# Patient Record
Sex: Male | Born: 1975 | Race: White | Hispanic: No | Marital: Single | Smoking: Never smoker
Health system: Southern US, Community
[De-identification: ages and names within clinical notes are randomized; demographics above are authoritative.]

## PROBLEM LIST (undated history)

## (undated) DIAGNOSIS — N289 Disorder of kidney and ureter, unspecified: Secondary | ICD-10-CM

## (undated) HISTORY — PX: CHOLECYSTECTOMY: SHX55

---

## 2011-11-20 ENCOUNTER — Encounter (HOSPITAL_COMMUNITY): Payer: Self-pay | Admitting: *Deleted

## 2011-11-20 ENCOUNTER — Emergency Department (HOSPITAL_COMMUNITY): Payer: Worker's Compensation

## 2011-11-20 ENCOUNTER — Emergency Department (HOSPITAL_COMMUNITY)
Admission: EM | Admit: 2011-11-20 | Discharge: 2011-11-20 | Disposition: A | Discharge status: Home / Self Care | Payer: Worker's Compensation | Attending: Emergency Medicine | Admitting: Emergency Medicine

## 2011-11-20 DIAGNOSIS — S5000XA Contusion of unspecified elbow, initial encounter: Secondary | ICD-10-CM | POA: Insufficient documentation

## 2011-11-20 DIAGNOSIS — Y9269 Other specified industrial and construction area as the place of occurrence of the external cause: Secondary | ICD-10-CM | POA: Insufficient documentation

## 2011-11-20 DIAGNOSIS — Z79899 Other long term (current) drug therapy: Secondary | ICD-10-CM | POA: Insufficient documentation

## 2011-11-20 DIAGNOSIS — Y990 Civilian activity done for income or pay: Secondary | ICD-10-CM | POA: Insufficient documentation

## 2011-11-20 DIAGNOSIS — W1789XA Other fall from one level to another, initial encounter: Secondary | ICD-10-CM | POA: Insufficient documentation

## 2011-11-20 DIAGNOSIS — S20219A Contusion of unspecified front wall of thorax, initial encounter: Secondary | ICD-10-CM | POA: Insufficient documentation

## 2011-11-20 MED ORDER — METHOCARBAMOL 500 MG PO TABS
1000.0000 mg | ORAL_TABLET | Freq: Once | ORAL | Status: AC
  Administered 2011-11-20: 1000 mg via ORAL
  Filled 2011-11-20: qty 2

## 2011-11-20 MED ORDER — HYDROCODONE-ACETAMINOPHEN 5-325 MG PO TABS
2.0000 | ORAL_TABLET | Freq: Once | ORAL | Status: AC
  Administered 2011-11-20: 2 via ORAL
  Filled 2011-11-20: qty 2

## 2011-11-20 MED ORDER — IBUPROFEN 800 MG PO TABS
800.0000 mg | ORAL_TABLET | Freq: Once | ORAL | Status: AC
  Administered 2011-11-20: 800 mg via ORAL
  Filled 2011-11-20: qty 1

## 2011-11-20 MED ORDER — ONDANSETRON HCL 4 MG PO TABS
4.0000 mg | ORAL_TABLET | Freq: Once | ORAL | Status: AC
  Administered 2011-11-20: 4 mg via ORAL
  Filled 2011-11-20: qty 1

## 2011-11-20 MED ORDER — IBUPROFEN 800 MG PO TABS: 800.0000 mg | ORAL_TABLET | Freq: Three times a day (TID) | ORAL | Status: AC

## 2011-11-20 MED ORDER — METHOCARBAMOL 500 MG PO TABS: ORAL_TABLET | ORAL | Status: DC

## 2011-11-20 MED ORDER — HYDROCODONE-ACETAMINOPHEN 5-500 MG PO TABS: 1.0000 | ORAL_TABLET | ORAL | Status: AC | PRN

## 2011-11-20 NOTE — ED Provider Notes (Signed)
History     CSN: [id number]  Arrival date & time 11/20/11  1324   First MD Initiated Contact with Patient 11/20/11 1422      Chief Complaint  Patient presents with  . Fall    (Consider location/radiation/quality/duration/timing/severity/associated sxs/prior treatment) HPI Comments: Patient states that on January 23 while at work, he was standing/walking on metal racks when he fell approximately 5-6 feet injuring his left elbow and left ribs. The patient had some soreness immediately after the fall but did not feel that he was incapacitated. Today he has pain with attempted range of motion of the elbow on the left, he has pain with taking deep breaths, and pain with palpation over the rib area. He presents at this time for additional evaluation of this problem.  Patient is a 36 y.o. male presenting with fall. The history is provided by the patient.  Fall Pertinent negatives include no abdominal pain and no hematuria.    History reviewed. No pertinent past medical history.  History reviewed. No pertinent past surgical history.  History reviewed. No pertinent family history.  History  Substance Use Topics  . Smoking status: Never Smoker   . Smokeless tobacco: Not on file  . Alcohol Use: Yes      Review of Systems  Constitutional: Negative for activity change.       All ROS Neg except as noted in HPI  HENT: Negative for nosebleeds and neck pain.   Eyes: Negative for photophobia and discharge.  Respiratory: Negative for cough, shortness of breath and wheezing.   Cardiovascular: Negative for chest pain and palpitations.  Gastrointestinal: Negative for abdominal pain and blood in stool.  Genitourinary: Negative for dysuria, frequency and hematuria.  Musculoskeletal: Negative for back pain and arthralgias.  Skin: Negative.   Neurological: Negative for dizziness, seizures and speech difficulty.  Psychiatric/Behavioral: Negative for hallucinations and confusion.    Allergies   Review of patient's allergies indicates no known allergies.  Home Medications   Current Outpatient Rx  Name Route Sig Dispense Refill  . AMPHETAMINE-DEXTROAMPHETAMINE 20 MG PO TABS Oral Take 20 mg by mouth 2 (two) times daily.    Marland Kitchen DIAZEPAM 10 MG PO TABS Oral Take 10 mg by mouth 3 (three) times daily as needed. Anger    . HYDROCODONE-ACETAMINOPHEN 10-500 MG PO TABS Oral Take 1 tablet by mouth 4 (four) times daily as needed. Pain    . NAPROXEN SODIUM 220 MG PO TABS Oral Take 220 mg by mouth 2 (two) times daily as needed. Headache    . HYDROCODONE-ACETAMINOPHEN 5-500 MG PO TABS Oral Take 1-2 tablets by mouth every 4 (four) hours as needed for pain. 20 tablet 0  . IBUPROFEN 800 MG PO TABS Oral Take 1 tablet (800 mg total) by mouth 3 (three) times daily. 21 tablet 0  . METHOCARBAMOL 500 MG PO TABS  2 tabs po for rib and back spasm 30 tablet 0    BP 140/79  Pulse 108  Temp(Src) 98 F (36.7 C) (Oral)  Resp 20  Ht 5\' 4"  (1.626 m)  Wt 152 lb (68.947 kg)  BMI 26.09 kg/m2  SpO2 98%  Physical Exam  Nursing note and vitals reviewed. Constitutional: He is oriented to person, place, and time. He appears well-developed and well-nourished.  Non-toxic appearance.  HENT:  Head: Normocephalic.  Right Ear: Tympanic membrane and external ear normal.  Left Ear: Tympanic membrane and external ear normal.  Eyes: EOM and lids are normal. Pupils are equal, round, and reactive  to light.  Neck: Normal range of motion. Neck supple. Carotid bruit is not present.  Cardiovascular: Normal rate, regular rhythm, normal heart sounds, intact distal pulses and normal pulses.   Pulmonary/Chest: Breath sounds normal. No respiratory distress.       Pain to palpation of the left mid to lower rib area, there is a bruise to the lateral left rib area. There is no crepitus and no palpable deformity.  Abdominal: Soft. Bowel sounds are normal. There is no tenderness. There is no guarding.  Musculoskeletal: Normal range of  motion.       There is full range of motion of the left shoulder, with some discomfort. There is pain with range of motion of the left elbow. There is good range of motion of the left wrist and fingers.  Lymphadenopathy:       Head (right side): No submandibular adenopathy present.       Head (left side): No submandibular adenopathy present.    He has no cervical adenopathy.  Neurological: He is alert and oriented to person, place, and time. He has normal strength. No cranial nerve deficit or sensory deficit.  Skin: Skin is warm and dry.  Psychiatric: He has a normal mood and affect. His speech is normal.    ED Course  Procedures (including critical care time)  Labs Reviewed - No data to display Dg Ribs Unilateral W/chest Left  11/20/2011  *RADIOLOGY REPORT*  Clinical Data: Fall  LEFT RIBS AND CHEST - 3+ VIEW  Comparison: None  Findings: The heart size and mediastinal contours are within normal limits.  There is a small hiatal hernia.  Both lungs are clear. The visualized skeletal structures are unremarkable.  IMPRESSION: No acute cardiopulmonary abnormalities.  No displaced rib fractures noted.  Hiatal hernia.  Original Report Authenticated By: Rosealee Albee, M.D.   Dg Elbow Complete Left  11/20/2011  *RADIOLOGY REPORT*  Clinical Data: Fall.  Pain  LEFT ELBOW - COMPLETE 3+ VIEW  Comparison: None.  Findings: There is no evidence of fracture or dislocation.  There is no evidence of arthropathy or other focal bone abnormality. Soft tissues are unremarkable.  IMPRESSION: Negative exam.  Original Report Authenticated By: Rosealee Albee, M.D.     1. Contusion, elbow   2. Contusion of ribs       MDM  I have reviewed nursing notes, vital signs, and all appropriate lab and imaging results for this patient.  X-rays are negative. Sling applied to the left elbow. Prescription for Vicodin 5 mg ibuprofen 800 mg, and Robaxin 500 mg given to the patient.      Kathie Dike, [place] 11/20/11  [phone]

## 2011-11-20 NOTE — ED Notes (Signed)
Fell at work, standing on metal rack ,fell ? Feet.  Pt unsure.  Pain lt elbow  And lt ribs.  No LOC

## 2011-11-23 NOTE — ED Provider Notes (Signed)
Medical screening examination/treatment/procedure(s) were performed by non-physician practitioner and as supervising physician I was immediately available for consultation/collaboration.   Shelda Jakes, MD 11/23/11 2023

## 2012-10-12 ENCOUNTER — Encounter (HOSPITAL_COMMUNITY): Payer: Self-pay

## 2012-10-12 ENCOUNTER — Emergency Department (HOSPITAL_COMMUNITY)
Admission: EM | Admit: 2012-10-12 | Discharge: 2012-10-12 | Disposition: A | Discharge status: Home / Self Care | Payer: Self-pay | Attending: Emergency Medicine | Admitting: Emergency Medicine

## 2012-10-12 ENCOUNTER — Emergency Department (HOSPITAL_COMMUNITY): Payer: Self-pay

## 2012-10-12 DIAGNOSIS — N23 Unspecified renal colic: Secondary | ICD-10-CM | POA: Insufficient documentation

## 2012-10-12 DIAGNOSIS — N390 Urinary tract infection, site not specified: Secondary | ICD-10-CM | POA: Insufficient documentation

## 2012-10-12 DIAGNOSIS — R319 Hematuria, unspecified: Secondary | ICD-10-CM | POA: Insufficient documentation

## 2012-10-12 DIAGNOSIS — R300 Dysuria: Secondary | ICD-10-CM | POA: Insufficient documentation

## 2012-10-12 DIAGNOSIS — R112 Nausea with vomiting, unspecified: Secondary | ICD-10-CM | POA: Insufficient documentation

## 2012-10-12 DIAGNOSIS — Z87442 Personal history of urinary calculi: Secondary | ICD-10-CM | POA: Insufficient documentation

## 2012-10-12 HISTORY — DX: Disorder of kidney and ureter, unspecified: N28.9

## 2012-10-12 LAB — URINALYSIS, ROUTINE W REFLEX MICROSCOPIC
Bilirubin Urine: NEGATIVE
Glucose, UA: NEGATIVE mg/dL
Nitrite: NEGATIVE
Specific Gravity, Urine: >1.03 — ABNORMAL HIGH (ref 1.005–1.030)
pH: 5.5 (ref 5.0–8.0)

## 2012-10-12 LAB — URINE MICROSCOPIC-ADD ON

## 2012-10-12 MED ORDER — CIPROFLOXACIN HCL 500 MG PO TABS: 500.0000 mg | ORAL_TABLET | Freq: Two times a day (BID) | ORAL | Status: DC

## 2012-10-12 MED ORDER — OXYCODONE-ACETAMINOPHEN 5-325 MG PO TABS: 1.0000 | ORAL_TABLET | ORAL | Status: AC | PRN

## 2012-10-12 MED ORDER — ONDANSETRON HCL 4 MG/2ML IJ SOLN
4.0000 mg | Freq: Once | INTRAMUSCULAR | Status: AC
  Administered 2012-10-12: 4 mg via INTRAVENOUS
  Filled 2012-10-12: qty 2

## 2012-10-12 MED ORDER — TAMSULOSIN HCL 0.4 MG PO CAPS: 0.4000 mg | ORAL_CAPSULE | Freq: Every day | ORAL | Status: DC

## 2012-10-12 MED ORDER — HYDROMORPHONE HCL PF 2 MG/ML IJ SOLN
2.0000 mg | Freq: Once | INTRAMUSCULAR | Status: AC
  Administered 2012-10-12: 2 mg via INTRAVENOUS
  Filled 2012-10-12: qty 1

## 2012-10-12 MED ORDER — KETOROLAC TROMETHAMINE 30 MG/ML IJ SOLN
30.0000 mg | Freq: Once | INTRAMUSCULAR | Status: AC
  Administered 2012-10-12: 30 mg via INTRAVENOUS
  Filled 2012-10-12: qty 1

## 2012-10-12 NOTE — ED Notes (Signed)
Complain of left flank pain since Saturday. Hx of stones

## 2012-10-12 NOTE — ED Provider Notes (Signed)
History     CSN: [id number]  Arrival date & time 10/12/12  1549   First MD Initiated Contact with Patient 10/12/12 1652      Chief Complaint  Patient presents with  . Flank Pain    (Consider location/radiation/quality/duration/timing/severity/associated sxs/prior treatment) HPI Comments: Is a 36 year old man who presents with left lower quadrant abdominal pain. This started about 3 days ago, and has gotten worse yesterday evening and today. He had vomiting yesterday. He is seeing blood in his urine. He has a prior history of kidney stones. He has been able to pass all of the stones in the past.  Patient is a 36 y.o. male presenting with flank pain. The history is provided by the patient. No language interpreter was used.  Flank Pain This is a new problem. The current episode started more than 2 days ago. Episode frequency: He has pain in the left flank and left lower quadrant intermittently, worse today. The problem has been gradually worsening. Associated symptoms comments: None.. Nothing aggravates the symptoms. Nothing relieves the symptoms. He has tried nothing for the symptoms.    Past Medical History  Diagnosis Date  . Renal disorder     History reviewed. No pertinent past surgical history.  No family history on file.  History  Substance Use Topics  . Smoking status: Never Smoker   . Smokeless tobacco: Not on file  . Alcohol Use: Yes      Review of Systems  Constitutional: Negative.   HENT: Negative.   Eyes: Negative.   Respiratory: Negative.   Cardiovascular: Negative.   Gastrointestinal: Positive for nausea and vomiting.       He had some vomiting yesterday.  Genitourinary: Positive for dysuria, hematuria and flank pain.  Musculoskeletal: Negative.   Skin: Negative.   Neurological: Negative.   Psychiatric/Behavioral: Negative.     Allergies  Review of patient's allergies indicates no known allergies.  Home Medications   Current Outpatient Rx  Name   Route  Sig  Dispense  Refill  . DIAZEPAM 10 MG PO TABS   Oral   Take 10 mg by mouth 3 (three) times daily as needed. Anger           BP 146/128  Pulse 106  Temp 97.9 F (36.6 C) (Oral)  Resp 22  Ht 5\' 4"  (1.626 m)  Wt 167 lb (75.751 kg)  BMI 28.67 kg/m2  SpO2 100%  Physical Exam  Nursing note and vitals reviewed. Constitutional: He is oriented to person, place, and time. He appears well-developed and well-nourished.       Patient and acute distress with left flank and left lower quadrant pain.  HENT:  Head: Normocephalic and atraumatic.  Right Ear: External ear normal.  Left Ear: External ear normal.  Mouth/Throat: Oropharynx is clear and moist.  Eyes: Conjunctivae normal and EOM are normal. Pupils are equal, round, and reactive to light.  Neck: Normal range of motion. Neck supple.  Cardiovascular: Normal rate and regular rhythm.   Pulmonary/Chest: Effort normal and breath sounds normal.  Abdominal: Soft. Bowel sounds are normal.       He localizes pain to the left flank and left lower quadrant. There is no mass or point tenderness.  Musculoskeletal: Normal range of motion.  Neurological: He is alert and oriented to person, place, and time.       No sensory or motor deficit.  Skin: Skin is warm and dry.  Psychiatric: He has a normal mood and affect. His behavior is  normal.    ED Course  Procedures (including critical care time)  Labs Reviewed  URINALYSIS, ROUTINE W REFLEX MICROSCOPIC - Abnormal; Notable for the following:    Specific Gravity, Urine >1.030 (*)     Hgb urine dipstick LARGE (*)     All other components within normal limits  URINE MICROSCOPIC-ADD ON - Abnormal; Notable for the following:    Bacteria, UA MANY (*)     All other components within normal limits  URINALYSIS, ROUTINE W REFLEX MICROSCOPIC   5:20 PM Patient was seen and had physical examination. Urinalysis showed hematuria. CT of the abdomen and pelvis without contrast was ordered.  Intravenous pain and nausea medicines were ordered.  6:47 PM His pain had eased, but now has come back full force.  Repeated IV Dilaudid, added IV Toradol. Waiting for reading on his CT of the abdomen/pelvis.  7:19 PM Results for orders placed during the hospital encounter of 10/12/12  URINALYSIS, ROUTINE W REFLEX MICROSCOPIC      Component Value Range   Color, Urine YELLOW  YELLOW   APPearance CLEAR  CLEAR   Specific Gravity, Urine >1.030 (*) 1.005 - 1.030   pH 5.5  5.0 - 8.0   Glucose, UA NEGATIVE  NEGATIVE mg/dL   Hgb urine dipstick LARGE (*) NEGATIVE   Bilirubin Urine NEGATIVE  NEGATIVE   Ketones, ur NEGATIVE  NEGATIVE mg/dL   Protein, ur NEGATIVE  NEGATIVE mg/dL   Urobilinogen, UA 0.2  0.0 - 1.0 mg/dL   Nitrite NEGATIVE  NEGATIVE   Leukocytes, UA NEGATIVE  NEGATIVE  URINE MICROSCOPIC-ADD ON      Component Value Range   RBC / HPF TOO NUMEROUS TO COUNT  <3 RBC/hpf   Bacteria, UA MANY (*) RARE   Ct Abdomen Pelvis Wo Contrast  10/12/2012  *RADIOLOGY REPORT*  Clinical Data: Left flank and left lower quadrant pain.  History of kidney stones.  CT ABDOMEN AND PELVIS WITHOUT CONTRAST  Technique:  Multidetector CT imaging of the abdomen and pelvis was performed following the standard protocol without intravenous contrast.  Comparison: None.  Findings: Large hiatal hernia.  Heart is normal size.  Lung bases are clear.  No effusions.  Gallbladder is contracted.  Liver, spleen, pancreas, adrenals and right kidney have an unremarkable unenhanced appearance.  4 mm stone in the mid pole of the left kidney.  No hydronephrosis.  No ureteral stones.  Calcifications in the anatomic pelvis bilaterally are outside of the distal ureters compatible with phleboliths. Urinary bladder is decompressed.  Appendix is visualized and is normal.  Scattered descending colonic diverticula without active diverticulitis.  Stomach is grossly unremarkable.  Small bowel is decompressed.  No free fluid, free air or  adenopathy.  No acute bony abnormality.  Aorta and iliac vessels are normal caliber.  IMPRESSION: Left nephrolithiasis.  No ureteral stones or hydronephrosis.  Large hiatal hernia.   Original Report Authenticated By: Charlett Nose, M.D.     UA shows many bacteria.  Will Rx for UTI, pain.  CT of abdomen/pelvis shows a left renal stone but no ureteral stone.  Referral to Dr. Jerre Simon, urologist on call, for followup.  1. Renal colic on left side   2. UTI (lower urinary tract infection)        Carleene Cooper III, MD 10/12/12 1924

## 2014-08-15 ENCOUNTER — Emergency Department (HOSPITAL_COMMUNITY)
Admission: EM | Admit: 2014-08-15 | Discharge: 2014-08-15 | Disposition: A | Discharge status: Home / Self Care | Payer: Self-pay | Attending: Emergency Medicine | Admitting: Emergency Medicine

## 2014-08-15 ENCOUNTER — Encounter (HOSPITAL_COMMUNITY): Payer: Self-pay | Admitting: Emergency Medicine

## 2014-08-15 ENCOUNTER — Emergency Department (HOSPITAL_COMMUNITY): Payer: Self-pay

## 2014-08-15 DIAGNOSIS — N200 Calculus of kidney: Secondary | ICD-10-CM | POA: Insufficient documentation

## 2014-08-15 DIAGNOSIS — R109 Unspecified abdominal pain: Secondary | ICD-10-CM

## 2014-08-15 DIAGNOSIS — R319 Hematuria, unspecified: Secondary | ICD-10-CM

## 2014-08-15 LAB — URINE MICROSCOPIC-ADD ON

## 2014-08-15 LAB — CBC WITH DIFFERENTIAL/PLATELET
Basophils Absolute: 0 10*3/uL (ref 0.0–0.1)
Basophils Relative: 0 % (ref 0–1)
EOS ABS: 0.4 10*3/uL (ref 0.0–0.7)
EOS PCT: 5 % (ref 0–5)
HEMATOCRIT: 45.7 % (ref 39.0–52.0)
Hemoglobin: 16.2 g/dL (ref 13.0–17.0)
LYMPHS ABS: 2.8 10*3/uL (ref 0.7–4.0)
LYMPHS PCT: 30 % (ref 12–46)
MCH: 32.8 pg (ref 26.0–34.0)
MCHC: 35.4 g/dL (ref 30.0–36.0)
MCV: 92.5 fL (ref 78.0–100.0)
MONO ABS: 0.7 10*3/uL (ref 0.1–1.0)
Monocytes Relative: 7 % (ref 3–12)
Neutro Abs: 5.3 10*3/uL (ref 1.7–7.7)
Neutrophils Relative %: 58 % (ref 43–77)
PLATELETS: 339 10*3/uL (ref 150–400)
RBC: 4.94 MIL/uL (ref 4.22–5.81)
RDW: 12.3 % (ref 11.5–15.5)
WBC: 9.2 10*3/uL (ref 4.0–10.5)

## 2014-08-15 LAB — URINALYSIS, ROUTINE W REFLEX MICROSCOPIC
BILIRUBIN URINE: NEGATIVE
GLUCOSE, UA: NEGATIVE mg/dL
Ketones, ur: NEGATIVE mg/dL
Leukocytes, UA: NEGATIVE
Nitrite: NEGATIVE
PROTEIN: NEGATIVE mg/dL
Specific Gravity, Urine: >1.03 — ABNORMAL HIGH (ref 1.005–1.030)
UROBILINOGEN UA: 0.2 mg/dL (ref 0.0–1.0)
pH: 5.5 (ref 5.0–8.0)

## 2014-08-15 LAB — BASIC METABOLIC PANEL
Anion gap: 11 (ref 5–15)
BUN: 18 mg/dL (ref 6–23)
CALCIUM: 9.8 mg/dL (ref 8.4–10.5)
CO2: 29 meq/L (ref 19–32)
CREATININE: 0.9 mg/dL (ref 0.50–1.35)
Chloride: 100 mEq/L (ref 96–112)
GFR calc Af Amer: >90 mL/min (ref >90)
GLUCOSE: 92 mg/dL (ref 70–99)
Potassium: 4.5 mEq/L (ref 3.7–5.3)
SODIUM: 140 meq/L (ref 137–147)

## 2014-08-15 MED ORDER — HYDROMORPHONE HCL 1 MG/ML IJ SOLN
1.0000 mg | INTRAMUSCULAR | Status: DC | PRN
  Administered 2014-08-15: 1 mg via INTRAVENOUS
  Filled 2014-08-15: qty 1

## 2014-08-15 MED ORDER — SODIUM CHLORIDE 0.9 % IV SOLN: Freq: Once | INTRAVENOUS | Status: DC

## 2014-08-15 MED ORDER — ONDANSETRON HCL 4 MG/2ML IJ SOLN
4.0000 mg | Freq: Once | INTRAMUSCULAR | Status: AC
  Administered 2014-08-15: 4 mg via INTRAVENOUS
  Filled 2014-08-15: qty 2

## 2014-08-15 MED ORDER — OXYCODONE-ACETAMINOPHEN 5-325 MG PO TABS
2.0000 | ORAL_TABLET | Freq: Once | ORAL | Status: AC
  Administered 2014-08-15: 2 via ORAL
  Filled 2014-08-15: qty 2

## 2014-08-15 MED ORDER — OXYCODONE-ACETAMINOPHEN 5-325 MG PO TABS: 2.0000 | ORAL_TABLET | ORAL | Status: DC | PRN

## 2014-08-15 MED ORDER — IBUPROFEN 800 MG PO TABS: 800.0000 mg | ORAL_TABLET | Freq: Three times a day (TID) | ORAL | Status: DC

## 2014-08-15 MED ORDER — TAMSULOSIN HCL 0.4 MG PO CAPS: 0.4000 mg | ORAL_CAPSULE | Freq: Every day | ORAL | Status: DC

## 2014-08-15 MED ORDER — TAMSULOSIN HCL 0.4 MG PO CAPS
0.4000 mg | ORAL_CAPSULE | Freq: Once | ORAL | Status: AC
  Administered 2014-08-15: 0.4 mg via ORAL
  Filled 2014-08-15: qty 1

## 2014-08-15 MED ORDER — KETOROLAC TROMETHAMINE 30 MG/ML IJ SOLN
30.0000 mg | Freq: Once | INTRAMUSCULAR | Status: AC
  Administered 2014-08-15: 30 mg via INTRAVENOUS
  Filled 2014-08-15: qty 1

## 2014-08-15 MED ORDER — SODIUM CHLORIDE 0.9 % IV BOLUS (SEPSIS)
1000.0000 mL | Freq: Once | INTRAVENOUS | Status: AC
  Administered 2014-08-15: 1000 mL via INTRAVENOUS

## 2014-08-15 NOTE — ED Notes (Signed)
Pain lt flank for 1 week, nausea.  No vomiting, Hx of kidney stones

## 2014-08-15 NOTE — ED Provider Notes (Signed)
CSN: [id number]     Arrival date & time 08/15/14  1257 History  This chart was scribed for Mark PorterMark Martia Dalby, MD by Mark Andrade, ED Scribe. This patient was seen in room [facility] and the patient's care was started 2:02 PM.    Chief Complaint  Patient presents with  . Flank Pain   The history is provided by the patient. No language interpreter was used.    HPI Comments: Mark SingerGregory Andrade is a 38 y.o. male with past medical history of kidney stones who presents to the Emergency Department complaining of 5-7 days of gradual onset, constant left flank pain that worsened with radiation to the left groin and testicle last night. He rates his pain as 10/10. He has associated nausea. He reports the last episode for which he was seen in the ED was 2 years ago. He denies vomiting.   Past Medical History  Diagnosis Date  . Renal disorder    History reviewed. No pertinent past surgical history. History reviewed. No pertinent family history. History  Substance Use Topics  . Smoking status: Never Smoker   . Smokeless tobacco: Not on file  . Alcohol Use: Yes    Review of Systems  Constitutional: Negative.  Negative for fever.  HENT: Negative.   Eyes: Negative.   Respiratory: Negative.   Cardiovascular: Negative.   Gastrointestinal: Positive for nausea. Negative for vomiting.  Genitourinary: Positive for flank pain and testicular pain.  Neurological: Negative.   Psychiatric/Behavioral: Negative.   All other systems reviewed and are negative.     Allergies  Review of patient's allergies indicates no known allergies.  Home Medications   Prior to Admission medications   Medication Sig Start Date End Date Taking? Authorizing Provider  ibuprofen (ADVIL,MOTRIN) 800 MG tablet Take 1 tablet (800 mg total) by mouth 3 (three) times daily. 08/15/14   Mark PorterMark Zamariah Seaborn, MD  oxyCODONE-acetaminophen (PERCOCET/ROXICET) 5-325 MG per tablet Take 2 tablets by mouth every 4 (four) hours as needed. 08/15/14   Mark PorterMark  Tesa Meadors, MD  tamsulosin (FLOMAX) 0.4 MG CAPS capsule Take 1 capsule (0.4 mg total) by mouth daily. 08/15/14   Mark PorterMark Alvy Alsop, MD   BP 131/96  Pulse 85  Temp(Src) 98.6 F (37 C) (Oral)  Resp 20  Ht 5\' 5"  (1.651 m)  Wt 167 lb (75.751 kg)  BMI 27.79 kg/m2  SpO2 100% Physical Exam  Nursing note and vitals reviewed. Constitutional: He is oriented to person, place, and time. He appears well-developed and well-nourished. No distress.  Leaning forward, appears apprehensive   HENT:  Head: Normocephalic.  Eyes: Conjunctivae are normal. Pupils are equal, round, and reactive to light. No scleral icterus.  Neck: Normal range of motion. Neck supple. No thyromegaly present.  Cardiovascular: Normal rate and regular rhythm.  Exam reveals no gallop and no friction rub.   No murmur heard. Pulmonary/Chest: Effort normal and breath sounds normal. No respiratory distress. He has no wheezes. He has no rales.  Abdominal: Soft. Bowel sounds are normal. He exhibits no distension. There is no tenderness. There is no rebound.  Genitourinary:  Left CVA tenderness   Musculoskeletal: Normal range of motion.  Neurological: He is alert and oriented to person, place, and time.  Skin: Skin is warm and dry. No rash noted.  Psychiatric: He has a normal mood and affect. His behavior is normal.    ED Course  Procedures   DIAGNOSTIC STUDIES: Oxygen Saturation is 100% on RA, normal by my interpretation.    COORDINATION OF CARE: 2:03 PM  Discussed treatment plan with pt at bedside and pt agreed to plan.   Labs Review Labs Reviewed  URINALYSIS, ROUTINE W REFLEX MICROSCOPIC - Abnormal; Notable for the following:    Specific Gravity, Urine >1.030 (*)    Hgb urine dipstick LARGE (*)    All other components within normal limits  CBC WITH DIFFERENTIAL  BASIC METABOLIC PANEL  URINE MICROSCOPIC-ADD ON    Imaging Review Ct Renal Stone Study  08/15/2014   CLINICAL DATA:  Left flank pain for 1 week.  Nausea and  hematuria.  EXAM: CT ABDOMEN AND PELVIS WITHOUT CONTRAST  TECHNIQUE: Multidetector CT imaging of the abdomen and pelvis was performed following the standard protocol without IV contrast.  COMPARISON:  CT abdomen and pelvis 10/12/2012.  FINDINGS: The lung bases are clear. No pleural or pericardial effusion. Large hiatal hernia is unchanged.  A 0.6 cm nonobstructing stone is seen in the midpole of the left kidney. There is no hydronephrosis on the right or left and no other urinary tract stones are identified.  Multiple gallstones are seen but there is no CT evidence of cholecystitis. The liver, adrenal glands, pancreas and biliary tree appear normal. The stomach, small and large bowel and appendix appear normal. No lytic or sclerotic bony lesion is identified.  IMPRESSION: No acute abnormality.  No change in a 0.6 cm nonobstructing left renal stone.  Multiple gallstones without CT evidence of cholecystitis.  Large hiatal hernia.   Electronically Signed   By: Drusilla Kannerhomas  Dalessio M.D.   On: 08/15/2014 14:44     EKG Interpretation None      MDM   Final diagnoses:  Flank pain  Kidney stone    Pt with significant relief after IV meds.  CT shows left renal stone. No dilatation of the collecting system currently. Possibilities passed a small stone. However the symptoms may be completely unrelated to his solitary 6 mm stone. Nonetheless he is symptom-free. Given urology for followup.   Mark PorterMark Trevante Tennell, MD 08/15/14 [phone]

## 2014-08-15 NOTE — Discharge Instructions (Signed)
Flank Pain °Flank pain refers to pain that is located on the side of the body between the upper abdomen and the back. The pain may occur over a short period of time (acute) or may be long-term or reoccurring (chronic). It may be mild or severe. Flank pain can be caused by many things. °CAUSES  °Some of the more common causes of flank pain include: °· Muscle strains.   °· Muscle spasms.   °· A disease of your spine (vertebral disk disease).   °· A lung infection (pneumonia).   °· Fluid around your lungs (pulmonary edema).   °· A kidney infection.   °· Kidney stones.   °· A very painful skin rash caused by the chickenpox virus (shingles).   °· Gallbladder disease.   °HOME CARE INSTRUCTIONS  °Home care will depend on the cause of your pain. In general, °· Rest as directed by your caregiver. °· Drink enough fluids to keep your urine clear or pale yellow. °· Only take over-the-counter or prescription medicines as directed by your caregiver. Some medicines may help relieve the pain. °· Tell your caregiver about any changes in your pain. °· Follow up with your caregiver as directed. °SEEK IMMEDIATE MEDICAL CARE IF:  °· Your pain is not controlled with medicine.   °· You have new or worsening symptoms. °· Your pain increases.   °· You have abdominal pain.   °· You have shortness of breath.   °· You have persistent nausea or vomiting.   °· You have swelling in your abdomen.   °· You feel faint or pass out.   °· You have blood in your urine. °· You have a fever or persistent symptoms for more than 2-3 days. °· You have a fever and your symptoms suddenly get worse. °MAKE SURE YOU:  °· Understand these instructions. °· Will watch your condition. °· Will get help right away if you are not doing well or get worse. °Document Released: 12/04/2005 Document Revised: 07/07/2012 Document Reviewed: 05/27/2012 °ExitCare® Patient Information ©2015 [employer]. This information is not intended to replace advice given to you by your  health care provider. Make sure you discuss any questions you have with your health care provider. ° °Kidney Stones °Kidney stones (urolithiasis) are deposits that form inside your kidneys. The intense pain is caused by the stone moving through the urinary tract. When the stone moves, the ureter goes into spasm around the stone. The stone is usually passed in the urine.  °CAUSES  °· A disorder that makes certain neck glands produce too much parathyroid hormone (primary hyperparathyroidism). °· A buildup of uric acid crystals, similar to gout in your joints. °· Narrowing (stricture) of the ureter. °· A kidney obstruction present at birth (congenital obstruction). °· Previous surgery on the kidney or ureters. °· Numerous kidney infections. °SYMPTOMS  °· Feeling sick to your stomach (nauseous). °· Throwing up (vomiting). °· Blood in the urine (hematuria). °· Pain that usually spreads (radiates) to the groin. °· Frequency or urgency of urination. °DIAGNOSIS  °· Taking a history and physical exam. °· Blood or urine tests. °· CT scan. °· Occasionally, an examination of the inside of the urinary bladder (cystoscopy) is performed. °TREATMENT  °· Observation. °· Increasing your fluid intake. °· Extracorporeal shock wave lithotripsy--This is a noninvasive procedure that uses shock waves to break up kidney stones. °· Surgery may be needed if you have severe pain or persistent obstruction. There are various surgical procedures. Most of the procedures are performed with the use of small instruments. Only small incisions are needed to accommodate these instruments,   so recovery time is minimized. °The size, location, and chemical composition are all important variables that will determine the proper choice of action for you. Talk to your health care provider to better understand your situation so that you will minimize the risk of injury to yourself and your kidney.  °HOME CARE INSTRUCTIONS  °· Drink enough water and fluids to  keep your urine clear or pale yellow. This will help you to pass the stone or stone fragments. °· Strain all urine through the provided strainer. Keep all particulate matter and stones for your health care provider to see. The stone causing the pain may be as small as a grain of salt. It is very important to use the strainer each and every time you pass your urine. The collection of your stone will allow your health care provider to analyze it and verify that a stone has actually passed. The stone analysis will often identify what you can do to reduce the incidence of recurrences. °· Only take over-the-counter or prescription medicines for pain, discomfort, or fever as directed by your health care provider. °· Make a follow-up appointment with your health care provider as directed. °· Get follow-up X-rays if required. The absence of pain does not always mean that the stone has passed. It may have only stopped moving. If the urine remains completely obstructed, it can cause loss of kidney function or even complete destruction of the kidney. It is your responsibility to make sure X-rays and follow-ups are completed. Ultrasounds of the kidney can show blockages and the status of the kidney. Ultrasounds are not associated with any radiation and can be performed easily in a matter of minutes. °SEEK MEDICAL CARE IF: °· You experience pain that is progressive and unresponsive to any pain medicine you have been prescribed. °SEEK IMMEDIATE MEDICAL CARE IF:  °· Pain cannot be controlled with the prescribed medicine. °· You have a fever or shaking chills. °· The severity or intensity of pain increases over 18 hours and is not relieved by pain medicine. °· You develop a new onset of abdominal pain. °· You feel faint or pass out. °· You are unable to urinate. °MAKE SURE YOU:  °· Understand these instructions. °· Will watch your condition. °· Will get help right away if you are not doing well or get worse. °Document Released:  10/13/2005 Document Revised: 06/15/2013 Document Reviewed: 03/16/2013 °ExitCare® Patient Information ©2015 [employer]. This information is not intended to replace advice given to you by your health care provider. Make sure you discuss any questions you have with your health care provider. ° °

## 2015-09-29 ENCOUNTER — Emergency Department (HOSPITAL_COMMUNITY)
Admission: EM | Admit: 2015-09-29 | Discharge: 2015-09-29 | Disposition: A | Discharge status: Home / Self Care | Payer: BLUE CROSS/BLUE SHIELD | Attending: Emergency Medicine | Admitting: Emergency Medicine

## 2015-09-29 ENCOUNTER — Encounter (HOSPITAL_COMMUNITY): Payer: Self-pay | Admitting: Emergency Medicine

## 2015-09-29 DIAGNOSIS — N390 Urinary tract infection, site not specified: Secondary | ICD-10-CM

## 2015-09-29 DIAGNOSIS — R300 Dysuria: Secondary | ICD-10-CM | POA: Diagnosis present

## 2015-09-29 DIAGNOSIS — N23 Unspecified renal colic: Secondary | ICD-10-CM | POA: Insufficient documentation

## 2015-09-29 LAB — URINALYSIS, ROUTINE W REFLEX MICROSCOPIC
BILIRUBIN URINE: NEGATIVE
GLUCOSE, UA: NEGATIVE mg/dL
Leukocytes, UA: NEGATIVE
Nitrite: NEGATIVE
PROTEIN: NEGATIVE mg/dL
Specific Gravity, Urine: 1.015 (ref 1.005–1.030)
pH: 8.5 — ABNORMAL HIGH (ref 5.0–8.0)

## 2015-09-29 LAB — BASIC METABOLIC PANEL
Anion gap: 9 (ref 5–15)
BUN: 15 mg/dL (ref 6–20)
CO2: 26 mmol/L (ref 22–32)
CREATININE: 0.84 mg/dL (ref 0.61–1.24)
Calcium: 9.8 mg/dL (ref 8.9–10.3)
Chloride: 105 mmol/L (ref 101–111)
GFR calc Af Amer: >60 mL/min (ref >60)
GFR calc non Af Amer: >60 mL/min (ref >60)
GLUCOSE: 87 mg/dL (ref 65–99)
Potassium: 4 mmol/L (ref 3.5–5.1)
SODIUM: 140 mmol/L (ref 135–145)

## 2015-09-29 LAB — URINE MICROSCOPIC-ADD ON

## 2015-09-29 MED ORDER — CEPHALEXIN 500 MG PO CAPS
500.0000 mg | ORAL_CAPSULE | Freq: Once | ORAL | Status: AC
  Administered 2015-09-29: 500 mg via ORAL
  Filled 2015-09-29: qty 1

## 2015-09-29 MED ORDER — HYDROMORPHONE HCL 1 MG/ML IJ SOLN
1.0000 mg | Freq: Once | INTRAMUSCULAR | Status: AC
  Administered 2015-09-29: 1 mg via INTRAVENOUS
  Filled 2015-09-29: qty 1

## 2015-09-29 MED ORDER — TAMSULOSIN HCL 0.4 MG PO CAPS
0.4000 mg | ORAL_CAPSULE | Freq: Once | ORAL | Status: AC
  Administered 2015-09-29: 0.4 mg via ORAL
  Filled 2015-09-29: qty 1

## 2015-09-29 MED ORDER — CEPHALEXIN 500 MG PO CAPS: 500.0000 mg | ORAL_CAPSULE | Freq: Four times a day (QID) | ORAL | Status: DC

## 2015-09-29 MED ORDER — TAMSULOSIN HCL 0.4 MG PO CAPS: 0.4000 mg | ORAL_CAPSULE | Freq: Every day | ORAL | Status: AC

## 2015-09-29 MED ORDER — OXYCODONE-ACETAMINOPHEN 5-325 MG PO TABS: 1.0000 | ORAL_TABLET | ORAL | Status: DC | PRN

## 2015-09-29 MED ORDER — KETOROLAC TROMETHAMINE 30 MG/ML IJ SOLN
30.0000 mg | Freq: Once | INTRAMUSCULAR | Status: AC
  Administered 2015-09-29: 30 mg via INTRAVENOUS
  Filled 2015-09-29: qty 1

## 2015-09-29 MED ORDER — ONDANSETRON HCL 4 MG/2ML IJ SOLN
4.0000 mg | Freq: Once | INTRAMUSCULAR | Status: AC
  Administered 2015-09-29: 4 mg via INTRAVENOUS
  Filled 2015-09-29: qty 2

## 2015-09-29 MED ORDER — MORPHINE SULFATE (PF) 4 MG/ML IV SOLN
4.0000 mg | Freq: Once | INTRAVENOUS | Status: AC
  Administered 2015-09-29: 4 mg via INTRAVENOUS
  Filled 2015-09-29: qty 1

## 2015-09-29 NOTE — ED Provider Notes (Signed)
CSN: [id number]     Arrival date & time 09/29/15  4098 History   First MD Initiated Contact with Patient 09/29/15 [phone]     Chief Complaint  Patient presents with  . Flank Pain     (Consider location/radiation/quality/duration/timing/severity/associated sxs/prior Treatment) The history is provided by the patient.   Tramon Crescenzo is a 39 y.o. male presenting with sudden onset left flank pain with radiation into his left lower abdomen and groin and consistent with his frequent episodes of kidney stone passage.  He reports passing a stone about 3 weeks ago, prior to that his last episode occurred one year ago.  He denies nausea or emesis, no fevers or chills, he does endorse hematuria, denies urinary frequency but increased pain with urination.  He denies penile discharge, scrotal swelling.  He has had no medications prior to arrival.  He is scheduled to establish care with a urologist in [place], Dr. Odis Luster in about 3 weeks.    Past Medical History  Diagnosis Date  . Renal disorder    History reviewed. No pertinent past surgical history. History reviewed. No pertinent family history. Social History  Substance Use Topics  . Smoking status: Never Smoker   . Smokeless tobacco: None  . Alcohol Use: Yes    Review of Systems  Constitutional: Negative for fever.  HENT: Negative for congestion and sore throat.   Eyes: Negative.   Respiratory: Negative for chest tightness and shortness of breath.   Cardiovascular: Negative for chest pain.  Gastrointestinal: Negative for nausea and abdominal pain.  Genitourinary: Positive for dysuria, hematuria and flank pain. Negative for penile swelling and scrotal swelling.  Musculoskeletal: Negative for joint swelling, arthralgias and neck pain.  Skin: Negative.  Negative for rash and wound.  Neurological: Negative for dizziness, weakness, light-headedness, numbness and headaches.  Psychiatric/Behavioral: Negative.       Allergies  Review of  patient's allergies indicates no known allergies.  Home Medications   Prior to Admission medications   Medication Sig Start Date End Date Taking? Authorizing Provider  cephALEXin (KEFLEX) 500 MG capsule Take 1 capsule (500 mg total) by mouth 4 (four) times daily. 09/29/15   Burgess Amor, PA-C  oxyCODONE-acetaminophen (PERCOCET/ROXICET) 5-325 MG tablet Take 1 tablet by mouth every 4 (four) hours as needed. 09/29/15   Burgess Amor, PA-C  tamsulosin (FLOMAX) 0.4 MG CAPS capsule Take 1 capsule (0.4 mg total) by mouth daily after supper. 09/29/15   Burgess Amor, PA-C   BP 135/83 mmHg  Pulse 83  Temp(Src) 97.9 F (36.6 C) (Oral)  Resp 17  Ht  (1.651 m)  Wt 79.833 kg  BMI 29.29 kg/m2  SpO2 97% Physical Exam  Constitutional: He appears well-developed and well-nourished.  HENT:  Head: Normocephalic and atraumatic.  Eyes: Conjunctivae are normal.  Neck: Normal range of motion.  Cardiovascular: Normal rate, regular rhythm, normal heart sounds and intact distal pulses.   Pulmonary/Chest: Effort normal and breath sounds normal. He has no wheezes.  Abdominal: Soft. Bowel sounds are normal. There is tenderness in the left lower quadrant. There is CVA tenderness.  llq pain with palpation, no guard or rebound.  Musculoskeletal: Normal range of motion.  Neurological: He is alert.  Skin: Skin is warm and dry.  Psychiatric: He has a normal mood and affect.  Nursing note and vitals reviewed.   ED Course  Procedures (including critical care time) Labs Review Labs Reviewed  URINALYSIS, ROUTINE W REFLEX MICROSCOPIC (NOT AT [facility]) - Abnormal; Notable for the following:  Color, Urine AMBER (*)    APPearance HAZY (*)    pH 8.5 (*)    Hgb urine dipstick LARGE (*)    Ketones, ur TRACE (*)    All other components within normal limits  URINE MICROSCOPIC-ADD ON - Abnormal; Notable for the following:    Squamous Epithelial / LPF 0-5 (*)    Bacteria, UA FEW (*)    All other components within normal  limits  URINE CULTURE  BASIC METABOLIC PANEL    Imaging Review No results found. I have personally reviewed and evaluated these images and lab results as part of my medical decision-making.   EKG Interpretation None      MDM   Final diagnoses:  Ureteral colic  UTI (lower urinary tract infection)    Pt given morphine/zofran per IV, then toradol with no significant improvement in pain.  Dilaudid IV was added with adequate pain relief.  Urine results with pyuria and bacteriuria, he will be covered with Keflex, first dose was given here, urine culture was ordered.  Urine strainer was given, advised increasing fluid intake.  He was prescribed Percocet in addition to Flomax for daily at bedtime use.  He is to follow-up with Dr. Nechama GuardBauer on December 19 in [place] who will be his new urologist.  Strict return instructions given for any worsened symptoms.    Burgess AmorJulie Kearie Mennen, PA-C 09/29/15 1108  Blane OharaJoshua Zavitz, MD 09/29/15 [phone]

## 2015-09-29 NOTE — Discharge Instructions (Signed)
Renal Colic Renal colic is pain that is caused by passing a kidney stone. The pain can be sharp and severe. It may be felt in the back, abdomen, side (flank), or groin. It can cause nausea. Renal colic can come and go. HOME CARE INSTRUCTIONS Watch your condition for any changes. The following actions may help to lessen any discomfort that you are feeling:  Take medicines only as directed by your health care provider.  Ask your health care provider if it is okay to take over-the-counter pain medicine.  Drink enough fluid to keep your urine clear or pale yellow. Drink 6-8 glasses of water each day.  Limit the amount of salt that you eat to less than 2 grams per day.  Reduce the amount of protein in your diet. Eat less meat, fish, nuts, and dairy.  Avoid foods such as spinach, rhubarb, nuts, or bran. These may make kidney stones more likely to form. SEEK MEDICAL CARE IF:  You have a fever or chills.  Your urine smells bad or looks cloudy.  You have pain or burning when you pass urine. SEEK IMMEDIATE MEDICAL CARE IF:  Your flank pain or groin pain suddenly worsens.  You become confused or disoriented or you lose consciousness.   This information is not intended to replace advice given to you by your health care provider. Make sure you discuss any questions you have with your health care provider.   Document Released: 07/23/2005 Document Revised: 11/03/2014 Document Reviewed: 08/23/2014 Elsevier Interactive Patient Education 2016 [employer]  Urinary Tract Infection A urinary tract infection (UTI) can occur any place along the urinary tract. The tract includes the kidneys, ureters, bladder, and urethra. A type of germ called bacteria often causes a UTI. UTIs are often helped with antibiotic medicine.  HOME CARE   If given, take antibiotics as told by your doctor. Finish them even if you start to feel better.  Drink enough fluids to keep your pee (urine) clear or pale  yellow.  Avoid tea, drinks with caffeine, and bubbly (carbonated) drinks.  Pee often. Avoid holding your pee in for a long time.  Pee before and after having sex (intercourse).  Wipe from front to back after you poop (bowel movement) if you are a woman. Use each tissue only once. GET HELP RIGHT AWAY IF:   You have back pain.  You have lower belly (abdominal) pain.  You have chills.  You feel sick to your stomach (nauseous).  You throw up (vomit).  Your burning or discomfort with peeing does not go away.  You have a fever.  Your symptoms are not better in 3 days. MAKE SURE YOU:   Understand these instructions.  Will watch your condition.  Will get help right away if you are not doing well or get worse.   This information is not intended to replace advice given to you by your health care provider. Make sure you discuss any questions you have with your health care provider.   Document Released: 03/31/2008 Document Revised: 11/03/2014 Document Reviewed: 05/13/2012 Elsevier Interactive Patient Education 2016 [employer]    Keep your appointment with Dr Nechama GuardBauer as planned.  In the interim, call him or return here if you develop and worsened symptoms including pain, fever, vomiting.  Take your next dose of flomax tomorrow evening.  Take 2 more doses of the keflex today.  Strain your urine so you will know if and when a stone passes.

## 2015-09-29 NOTE — ED Notes (Signed)
Pt states that he has been having left flank pain since last night.  Hx of kidney stones.

## 2015-10-01 LAB — URINE CULTURE: CULTURE: NO GROWTH

## 2015-10-27 ENCOUNTER — Emergency Department (HOSPITAL_COMMUNITY)
Admission: EM | Admit: 2015-10-27 | Discharge: 2015-10-27 | Disposition: A | Discharge status: Home / Self Care | Payer: BLUE CROSS/BLUE SHIELD | Attending: Emergency Medicine | Admitting: Emergency Medicine

## 2015-10-27 ENCOUNTER — Encounter (HOSPITAL_COMMUNITY): Payer: Self-pay | Admitting: Emergency Medicine

## 2015-10-27 ENCOUNTER — Emergency Department (HOSPITAL_COMMUNITY): Payer: BLUE CROSS/BLUE SHIELD

## 2015-10-27 DIAGNOSIS — R319 Hematuria, unspecified: Secondary | ICD-10-CM | POA: Insufficient documentation

## 2015-10-27 DIAGNOSIS — R1032 Left lower quadrant pain: Secondary | ICD-10-CM | POA: Insufficient documentation

## 2015-10-27 DIAGNOSIS — R300 Dysuria: Secondary | ICD-10-CM | POA: Insufficient documentation

## 2015-10-27 DIAGNOSIS — Z87448 Personal history of other diseases of urinary system: Secondary | ICD-10-CM | POA: Insufficient documentation

## 2015-10-27 DIAGNOSIS — Z79899 Other long term (current) drug therapy: Secondary | ICD-10-CM | POA: Diagnosis not present

## 2015-10-27 DIAGNOSIS — R109 Unspecified abdominal pain: Secondary | ICD-10-CM

## 2015-10-27 DIAGNOSIS — R110 Nausea: Secondary | ICD-10-CM | POA: Insufficient documentation

## 2015-10-27 LAB — CBC WITH DIFFERENTIAL/PLATELET
BASOS PCT: 0 %
Basophils Absolute: 0 10*3/uL (ref 0.0–0.1)
Eosinophils Absolute: 0.4 10*3/uL (ref 0.0–0.7)
Eosinophils Relative: 4 %
HEMATOCRIT: 42.7 % (ref 39.0–52.0)
HEMOGLOBIN: 14.8 g/dL (ref 13.0–17.0)
LYMPHS ABS: 2 10*3/uL (ref 0.7–4.0)
Lymphocytes Relative: 21 %
MCH: 31.7 pg (ref 26.0–34.0)
MCHC: 34.7 g/dL (ref 30.0–36.0)
MCV: 91.4 fL (ref 78.0–100.0)
Monocytes Absolute: 0.9 10*3/uL (ref 0.1–1.0)
Monocytes Relative: 9 %
NEUTROS ABS: 6.1 10*3/uL (ref 1.7–7.7)
NEUTROS PCT: 66 %
Platelets: 464 10*3/uL — ABNORMAL HIGH (ref 150–400)
RBC: 4.67 MIL/uL (ref 4.22–5.81)
RDW: 12.2 % (ref 11.5–15.5)
WBC: 9.4 10*3/uL (ref 4.0–10.5)

## 2015-10-27 LAB — URINALYSIS, ROUTINE W REFLEX MICROSCOPIC
BILIRUBIN URINE: NEGATIVE
GLUCOSE, UA: NEGATIVE mg/dL
Ketones, ur: NEGATIVE mg/dL
Leukocytes, UA: NEGATIVE
Nitrite: NEGATIVE
PH: 7.5 (ref 5.0–8.0)
Protein, ur: NEGATIVE mg/dL
Specific Gravity, Urine: 1.015 (ref 1.005–1.030)

## 2015-10-27 LAB — URINE MICROSCOPIC-ADD ON
Bacteria, UA: NONE SEEN
Squamous Epithelial / LPF: NONE SEEN

## 2015-10-27 LAB — COMPREHENSIVE METABOLIC PANEL
ALBUMIN: 4.1 g/dL (ref 3.5–5.0)
ALK PHOS: 71 U/L (ref 38–126)
ALT: 68 U/L — ABNORMAL HIGH (ref 17–63)
ANION GAP: 9 (ref 5–15)
AST: 50 U/L — ABNORMAL HIGH (ref 15–41)
BILIRUBIN TOTAL: 1.1 mg/dL (ref 0.3–1.2)
BUN: 18 mg/dL (ref 6–20)
CO2: 27 mmol/L (ref 22–32)
Calcium: 9.8 mg/dL (ref 8.9–10.3)
Chloride: 106 mmol/L (ref 101–111)
Creatinine, Ser: 0.83 mg/dL (ref 0.61–1.24)
GFR calc Af Amer: >60 mL/min (ref >60)
GFR calc non Af Amer: >60 mL/min (ref >60)
Glucose, Bld: 106 mg/dL — ABNORMAL HIGH (ref 65–99)
Potassium: 4.1 mmol/L (ref 3.5–5.1)
Sodium: 142 mmol/L (ref 135–145)
TOTAL PROTEIN: 8 g/dL (ref 6.5–8.1)

## 2015-10-27 MED ORDER — ONDANSETRON 4 MG PO TBDP: ORAL_TABLET | ORAL | Status: AC

## 2015-10-27 MED ORDER — SODIUM CHLORIDE 0.9 % IV BOLUS (SEPSIS)
1000.0000 mL | Freq: Once | INTRAVENOUS | Status: AC
  Administered 2015-10-27: 1000 mL via INTRAVENOUS

## 2015-10-27 MED ORDER — ONDANSETRON HCL 4 MG/2ML IJ SOLN
4.0000 mg | Freq: Once | INTRAMUSCULAR | Status: AC
  Administered 2015-10-27: 4 mg via INTRAVENOUS
  Filled 2015-10-27: qty 2

## 2015-10-27 MED ORDER — HYDROMORPHONE HCL 1 MG/ML IJ SOLN
1.0000 mg | Freq: Once | INTRAMUSCULAR | Status: AC
  Administered 2015-10-27: 1 mg via INTRAVENOUS
  Filled 2015-10-27: qty 1

## 2015-10-27 MED ORDER — OXYCODONE-ACETAMINOPHEN 5-325 MG PO TABS: 1.0000 | ORAL_TABLET | ORAL | Status: AC | PRN

## 2015-10-27 MED ORDER — KETOROLAC TROMETHAMINE 30 MG/ML IJ SOLN
30.0000 mg | Freq: Once | INTRAMUSCULAR | Status: AC
  Administered 2015-10-27: 30 mg via INTRAVENOUS
  Filled 2015-10-27: qty 1

## 2015-10-27 NOTE — ED Provider Notes (Signed)
CSN: [id number]     Arrival date & time 10/27/15  1018 History  By signing my name below, I, Placido Sou, attest that this documentation has been prepared under the direction and in the presence of Marily Memos, MD. Electronically Signed: Placido Sou, ED Scribe. 10/27/2015. 1:16 PM.   Chief Complaint  Patient presents with  . Flank Pain   The history is provided by the patient. No language interpreter was used.    HPI Comments: Mark Andrade is a 39 y.o. male who presents to the Emergency Department due to constant, moderate, stabbing, left flank pain with onset last night and worsening pain beginning earlier today. Pt was seen on 12/3 for similar symptoms and has a follow up appointment with a urologist in January which was pushed back due to a cholecystectomy which was performed 2 weeks ago. He notes associated dysuria, nausea and hematuria. Pt confirms these symptoms are similar to past occurences. He denies fevers and bloody stools.   Past Medical History  Diagnosis Date  . Renal disorder    History reviewed. No pertinent past surgical history. History reviewed. No pertinent family history. Social History  Substance Use Topics  . Smoking status: Never Smoker   . Smokeless tobacco: None  . Alcohol Use: Yes     Comment: occasional    Review of Systems  Gastrointestinal: Positive for nausea. Negative for vomiting, blood in stool and anal bleeding.  Genitourinary: Positive for dysuria, hematuria and flank pain.  All other systems reviewed and are negative.   Allergies  Morphine and related  Home Medications   Prior to Admission medications   Medication Sig Start Date End Date Taking? Authorizing Provider  tamsulosin (FLOMAX) 0.4 MG CAPS capsule Take 1 capsule (0.4 mg total) by mouth daily after supper. 09/29/15  Yes Raynelle Fanning Idol, PA-C  cephALEXin (KEFLEX) 500 MG capsule Take 1 capsule (500 mg total) by mouth 4 (four) times daily. Patient not taking: Reported on  10/27/2015 09/29/15   Burgess Amor, PA-C  ondansetron (ZOFRAN ODT) 4 MG disintegrating tablet  ODT q4 hours prn nausea/vomit 10/27/15   Marily Memos, MD  oxyCODONE-acetaminophen (PERCOCET/ROXICET) 5-325 MG tablet Take 1 tablet by mouth every 4 (four) hours as needed. 10/27/15   Barbara Cower Pattie Flaharty, MD   BP 135/82 mmHg  Pulse 88  Temp(Src) 98.6 F (37 C) (Oral)  Resp 16  Ht  (1.651 m)  Wt 167 lb (75.751 kg)  BMI 27.79 kg/m2  SpO2 96%    Physical Exam  Constitutional: He is oriented to person, place, and time. He appears well-developed and well-nourished.  HENT:  Head: Normocephalic and atraumatic.  Mouth/Throat: No oropharyngeal exudate.  Neck: Normal range of motion. No tracheal deviation present.  Cardiovascular: Normal rate, regular rhythm, normal heart sounds and intact distal pulses.  Exam reveals no gallop and no friction rub.   No murmur heard. Pulmonary/Chest: Effort normal. No respiratory distress. He has no wheezes. He has no rales.  Abdominal: Soft. There is tenderness.  Left CVA tenderness; LLQ tenderness   Musculoskeletal: Normal range of motion.  Neurological: He is alert and oriented to person, place, and time.  Skin: Skin is warm and dry. No rash noted. He is not diaphoretic.  Incision around belly button that is clean, dry and intact; no erythema or tenderness  Psychiatric: He has a normal mood and affect. His behavior is normal.  Nursing note and vitals reviewed.   ED Course  Procedures  DIAGNOSTIC STUDIES: Oxygen Saturation is 100% on RA,  normal by my interpretation.    COORDINATION OF CARE: 12:35 PM Pt presents today due to left flank pain. Discussed next steps with pt and he agreed to the plan.   Labs Review Labs Reviewed  URINALYSIS, ROUTINE W REFLEX MICROSCOPIC (NOT AT [facility]) - Abnormal; Notable for the following:    Hgb urine dipstick LARGE (*)    All other components within normal limits  CBC WITH DIFFERENTIAL/PLATELET - Abnormal; Notable for the  following:    Platelets 464 (*)    All other components within normal limits  COMPREHENSIVE METABOLIC PANEL - Abnormal; Notable for the following:    Glucose, Bld 106 (*)    AST 50 (*)    ALT 68 (*)    All other components within normal limits  URINE MICROSCOPIC-ADD ON    Imaging Review Ct Renal Stone Study  10/27/2015  CLINICAL DATA:  Constant, moderate, stabbing, left flank pain with onset last night and worsening today. Pt was seen on 12/3 for similar symptoms, had a cholecystectomy which was performed 2 weeks ago. He notes associated dysuria, nausea and hematuria. EXAM: CT ABDOMEN AND PELVIS WITHOUT CONTRAST TECHNIQUE: Multidetector CT imaging of the abdomen and pelvis was performed following the standard protocol without IV contrast. COMPARISON:  10/11/2015 FINDINGS: Lower chest: Heart size is normal. No imaged pericardial effusion or significant coronary artery calcifications. There is minimal left lower lobe atelectasis. Upper abdomen: There is a complex collection of fluid and gas within the gallbladder fossa region. Surgical clips are identified within the surgical bed. Collection measures at least 7.7 x 4.6 cm. Small hepatic granulomata are present. No focal abnormality identified within the spleen, pancreas, or adrenal glands. Small intrarenal calculi are identified in the kidneys bilaterally, largest on the left measure pain 0.5 cm. There is no hydronephrosis or ureteral stones. Gastrointestinal tract: Large hiatal hernia is present. Distal stomach has a normal appearance. Small bowel loops are normal in appearance. Numerous colonic diverticula are present. The appendix is well seen and has a normal appearance. Pelvis: The urinary bladder, prostate gland, and seminal vesicles have a normal appearance. Visualized urethra is unremarkable. Multiple pelvic phleboliths are identified. No distal urinary tract calculi are identified. No free pelvic fluid. Retroperitoneum: No evidence for aortic  aneurysm. No retroperitoneal or mesenteric adenopathy. Abdominal wall: Postoperative changes are identified in the anterior abdominal wall. No focal fluid collections. Osseous structures: Negative IMPRESSION: 1. Complex collection of fluid and gas within the gallbladder fossa region is greater than expected for typical postoperative changes given the interval of 2 weeks since surgery. Findings are suspicious for abscess following cholecystectomy. Therefore correlation with physical exam findings recommended. 2. Bilateral nonobstructing intrarenal stones. No evidence for ureteral stone or other urinary tract obstruction. 3. Expected postoperative changes in the anterior abdominal wall. 4. Large hiatal hernia. 5. Colonic diverticulosis. 6. Normal appendix. Electronically Signed   By: Norva PavlovElizabeth  Brown M.D.   On: 10/27/2015 13:39   I have personally reviewed and evaluated these images and lab results as part of my medical decision-making.   EKG Interpretation None      MDM   Final diagnoses:  Flank pain    Possibly passed kidney stone, no obvious obstructing stone at this time. Labs ok. CT with fluid collection at gallbladder fossa, so cbc and cmp checked. No fever, vomiting, leukocytosis, minimally bumped LFT's. D/W his surgeion, Dr. Gabriel Cirriemason, who already has follow up with him scheduled on Wednesday. He stated without white count, RUQ pain/ttp, or other worrisome findings he  doubted it was an abscess which I agree with. Patient pain controlled. Tolerating PO. Stable for dc with surgery and urology follow up as already scheduled.   I personally performed the services described in this documentation, which was scribed in my presence. The recorded information has been reviewed and is accurate.    Marily Memos, MD 10/27/15 [phone]

## 2015-10-27 NOTE — ED Notes (Signed)
Report given to K. Belton RN 

## 2015-10-27 NOTE — ED Notes (Signed)
Pt states that he has been having left flank pain with hematuria since last night.

## 2016-01-04 ENCOUNTER — Emergency Department (HOSPITAL_COMMUNITY)
Admission: EM | Admit: 2016-01-04 | Discharge: 2016-01-04 | Disposition: A | Discharge status: Home / Self Care | Payer: BLUE CROSS/BLUE SHIELD | Attending: Emergency Medicine | Admitting: Emergency Medicine

## 2016-01-04 ENCOUNTER — Encounter (HOSPITAL_COMMUNITY): Payer: Self-pay | Admitting: Emergency Medicine

## 2016-01-04 ENCOUNTER — Emergency Department (HOSPITAL_COMMUNITY): Payer: BLUE CROSS/BLUE SHIELD

## 2016-01-04 DIAGNOSIS — Z79899 Other long term (current) drug therapy: Secondary | ICD-10-CM | POA: Diagnosis not present

## 2016-01-04 DIAGNOSIS — R319 Hematuria, unspecified: Secondary | ICD-10-CM

## 2016-01-04 DIAGNOSIS — Z791 Long term (current) use of non-steroidal anti-inflammatories (NSAID): Secondary | ICD-10-CM | POA: Insufficient documentation

## 2016-01-04 DIAGNOSIS — R109 Unspecified abdominal pain: Secondary | ICD-10-CM

## 2016-01-04 LAB — CBC WITH DIFFERENTIAL/PLATELET
Basophils Absolute: 0 K/uL (ref 0.0–0.1)
Basophils Relative: 0 %
Eosinophils Absolute: 0.2 K/uL (ref 0.0–0.7)
Eosinophils Relative: 2 %
HCT: 45.7 % (ref 39.0–52.0)
Hemoglobin: 15.9 g/dL (ref 13.0–17.0)
Lymphocytes Relative: 35 %
Lymphs Abs: 3.2 K/uL (ref 0.7–4.0)
MCH: 30.9 pg (ref 26.0–34.0)
MCHC: 34.8 g/dL (ref 30.0–36.0)
MCV: 88.7 fL (ref 78.0–100.0)
Monocytes Absolute: 0.6 K/uL (ref 0.1–1.0)
Monocytes Relative: 7 %
Neutro Abs: 5.2 K/uL (ref 1.7–7.7)
Neutrophils Relative %: 56 %
Platelets: 274 K/uL (ref 150–400)
RBC: 5.15 MIL/uL (ref 4.22–5.81)
RDW: 12.6 % (ref 11.5–15.5)
WBC: 9.1 K/uL (ref 4.0–10.5)

## 2016-01-04 LAB — BASIC METABOLIC PANEL WITH GFR
Anion gap: 8 (ref 5–15)
BUN: 16 mg/dL (ref 6–20)
CO2: 27 mmol/L (ref 22–32)
Calcium: 9.6 mg/dL (ref 8.9–10.3)
Chloride: 106 mmol/L (ref 101–111)
Creatinine, Ser: 0.76 mg/dL (ref 0.61–1.24)
GFR calc Af Amer: >60 mL/min (ref >60)
GFR calc non Af Amer: >60 mL/min (ref >60)
Glucose, Bld: 89 mg/dL (ref 65–99)
Potassium: 4.6 mmol/L (ref 3.5–5.1)
Sodium: 141 mmol/L (ref 135–145)

## 2016-01-04 LAB — URINALYSIS, ROUTINE W REFLEX MICROSCOPIC
Bilirubin Urine: NEGATIVE
Glucose, UA: NEGATIVE mg/dL
Ketones, ur: NEGATIVE mg/dL
Leukocytes, UA: NEGATIVE
Nitrite: NEGATIVE
PH: 6.5 (ref 5.0–8.0)
PROTEIN: NEGATIVE mg/dL
SPECIFIC GRAVITY, URINE: 1.01 (ref 1.005–1.030)

## 2016-01-04 LAB — URINE MICROSCOPIC-ADD ON
Bacteria, UA: NONE SEEN
Squamous Epithelial / HPF: NONE SEEN

## 2016-01-04 MED ORDER — ACETAMINOPHEN 500 MG PO TABS
1000.0000 mg | ORAL_TABLET | Freq: Once | ORAL | Status: AC
  Administered 2016-01-04: 1000 mg via ORAL
  Filled 2016-01-04: qty 2

## 2016-01-04 MED ORDER — KETOROLAC TROMETHAMINE 60 MG/2ML IM SOLN
60.0000 mg | Freq: Once | INTRAMUSCULAR | Status: AC
  Administered 2016-01-04: 60 mg via INTRAMUSCULAR
  Filled 2016-01-04: qty 2

## 2016-01-04 MED ORDER — HYDROCODONE-ACETAMINOPHEN 5-325 MG PO TABS
1.0000 | ORAL_TABLET | ORAL | Status: AC | PRN
Start: 2016-01-04 — End: ?

## 2016-01-04 MED ORDER — NAPROXEN 500 MG PO TABS: 500.0000 mg | ORAL_TABLET | Freq: Two times a day (BID) | ORAL | Status: AC | PRN

## 2016-01-04 NOTE — Discharge Instructions (Signed)
Watch for signs of infection such as fevers/ chills.  For severe pain take norco or vicodin however realize they have the potential for addiction and it can make you sleepy and has tylenol in it.  No operating machinery while taking. If you were given medicines take as directed.  If you are on coumadin or contraceptives realize their levels and effectiveness is altered by many different medicines.  If you have any reaction (rash, tongues swelling, other) to the medicines stop taking and see a physician.    If your blood pressure was elevated in the ER make sure you follow up for management with a primary doctor or return for chest pain, shortness of breath or stroke symptoms.  Please follow up as directed and return to the ER or see a physician for new or worsening symptoms.  Thank you. Filed Vitals:   01/04/16 1212 01/04/16 1600  BP: 116/77 120/78  Pulse: 63 82  Temp: 98.1 F (36.7 C) 98 F (36.7 C)  TempSrc: Oral Oral  Resp: 14 18  Height: 5\' 5"  (1.651 m)   Weight: 170 lb (77.111 kg)   SpO2: 100% 100%

## 2016-01-04 NOTE — ED Notes (Signed)
PT c/o left flank pain with blood noted in urine starting this am. PT states hx of kidney stones.

## 2016-01-04 NOTE — ED Provider Notes (Signed)
CSN: [id number]     Arrival date & time 01/04/16  1149 History   First MD Initiated Contact with Patient 01/04/16 1557     Chief Complaint  Patient presents with  . Flank Pain     (Consider location/radiation/quality/duration/timing/severity/associated sxs/prior Treatment) HPI Comments: 40 year old male with history of kidney stones none requiring procedure presents with left flank pain rating to the groin since yesterday. This is similar to previous kidney stone history. No vomiting or fevers. Patient feels well otherwise. Patient does not have a current urologist.  Patient is a 40 y.o. male presenting with flank pain. The history is provided by the patient.  Flank Pain Pertinent negatives include no chest pain, no abdominal pain, no headaches and no shortness of breath.    Past Medical History  Diagnosis Date  . Renal disorder    Past Surgical History  Procedure Laterality Date  . Cholecystectomy     History reviewed. No pertinent family history. Social History  Substance Use Topics  . Smoking status: Never Smoker   . Smokeless tobacco: None  . Alcohol Use: Yes     Comment: occasional    Review of Systems  Constitutional: Negative for fever and chills.  HENT: Negative for congestion.   Eyes: Negative for visual disturbance.  Respiratory: Negative for shortness of breath.   Cardiovascular: Negative for chest pain.  Gastrointestinal: Negative for vomiting and abdominal pain.  Genitourinary: Positive for hematuria and flank pain. Negative for dysuria.  Musculoskeletal: Negative for back pain, neck pain and neck stiffness.  Skin: Negative for rash.  Neurological: Negative for light-headedness and headaches.      Allergies  Review of patient's allergies indicates no known allergies.  Home Medications   Prior to Admission medications   Medication Sig Start Date End Date Taking? Authorizing Provider  HYDROcodone-acetaminophen (NORCO) 5-325 MG tablet Take 1-2 tablets  by mouth every 4 (four) hours as needed for severe pain. 01/04/16   Blane OharaJoshua Alyene Predmore, MD  naproxen (NAPROSYN) 500 MG tablet Take 1 tablet (500 mg total) by mouth 2 (two) times daily as needed. 01/04/16   Blane OharaJoshua Tyrica Afzal, MD  ondansetron (ZOFRAN ODT) 4 MG disintegrating tablet 4mg  ODT q4 hours prn nausea/vomit Patient not taking: Reported on 01/04/2016 10/27/15   Marily MemosJason Mesner, MD  oxyCODONE-acetaminophen (PERCOCET/ROXICET) 5-325 MG tablet Take 1 tablet by mouth every 4 (four) hours as needed. Patient not taking: Reported on 01/04/2016 10/27/15   Marily MemosJason Mesner, MD  tamsulosin (FLOMAX) 0.4 MG CAPS capsule Take 1 capsule (0.4 mg total) by mouth daily after supper. Patient not taking: Reported on 01/04/2016 09/29/15   Burgess AmorJulie Idol, PA-C   BP 120/78 mmHg  Pulse 82  Temp(Src) 98 F (36.7 C) (Oral)  Resp 18  Ht 5\' 5"  (1.651 m)  Wt 170 lb (77.111 kg)  BMI 28.29 kg/m2  SpO2 100% Physical Exam  Constitutional: He is oriented to person, place, and time. He appears well-developed and well-nourished.  HENT:  Head: Normocephalic and atraumatic.  Eyes: Conjunctivae are normal. Right eye exhibits no discharge. Left eye exhibits no discharge.  Neck: Normal range of motion. Neck supple. No tracheal deviation present.  Cardiovascular: Normal rate and regular rhythm.   Pulmonary/Chest: Effort normal and breath sounds normal.  Abdominal: Soft. He exhibits no distension. There is tenderness (mild left lower flank). There is no guarding.  Musculoskeletal: He exhibits no edema.  Neurological: He is alert and oriented to person, place, and time.  Skin: Skin is warm. No rash noted.  Psychiatric: He has  a normal mood and affect.  Nursing note and vitals reviewed.   ED Course  Procedures (including critical care time) Labs Review Labs Reviewed  URINALYSIS, ROUTINE W REFLEX MICROSCOPIC (NOT AT [facility]) - Abnormal; Notable for the following:    Hgb urine dipstick LARGE (*)    All other components within normal limits  CBC  WITH DIFFERENTIAL/PLATELET  BASIC METABOLIC PANEL  URINE MICROSCOPIC-ADD ON    Imaging Review US Renal  01/04/2016  CLINICAL DATA:  Kidney stones.  Left flank pain since last evening. EXAM: RENAL / URINARY TRACT ULTRASOUND COMPLETE COMPARISON:  CT of the abdomen and pelvis 10/27/2015. FINDINGS: Right Kidney: Length: 11.1 cm, within normal limits. Echogenicity within normal limits. No mass or hydronephrosis visualized. Left Kidney: Length: 11.4 cm, within normal limits. A 7.4 mm nonobstructive stone is again noted in the midportion of the left kidney. There is mild fullness in the left renal collecting system inferiorly, similar to the CT scan. Bladder: Appears normal for degree of bladder distention. The left ureteral jet is visualized. The right ureteral jet is not well seen. IMPRESSION: 1. Nonobstructing 7.4 mm stone in the midportion of the left kidney is stable. 2. Mild fullness of the inferior aspect of the left renal collecting system is stable. 3. Normal appearance of the right kidney. Electronically Signed   By: Marin Roberts M.D.   On: 01/04/2016 14:56   I have personally reviewed and evaluated these images and lab results as part of my medical decision-making.   EKG Interpretation None      MDM   Final diagnoses:  Left flank pain  Hematuria   Well-appearing patient with kidney stone history. Patient had blood work done and ultrasound done in triage. Blood work and urinalysis unremarkable for hematuria. No significant hydronephrosis. Discussed pain control and outpatient follow-up with urology next week.  Results and differential diagnosis were discussed with the patient/parent/guardian. Xrays were independently reviewed by myself.  Close follow up outpatient was discussed, comfortable with the plan.   Medications  ketorolac (TORADOL) injection 60 mg (60 mg Intramuscular Given 01/04/16 1615)  acetaminophen (TYLENOL) tablet 1,000 mg (1,000 mg Oral Given 01/04/16 1615)     Filed Vitals:   01/04/16 1212 01/04/16 1600  BP: 116/77 120/78  Pulse: 63 82  Temp: 98.1 F (36.7 C) 98 F (36.7 C)  TempSrc: Oral Oral  Resp: 14 18  Height:  (1.651 m)   Weight: 170 lb (77.111 kg)   SpO2: 100% 100%    Final diagnoses:  Left flank pain  Hematuria       Blane Ohara, MD 01/04/16 [phone]

## 2017-01-02 IMAGING — CT CT RENAL STONE PROTOCOL
3 of 4 series · 8 of 46 positions shown, 15 images · non-contrast
Comparison: 10/11/2015

CLINICAL DATA: Constant, moderate, stabbing, left flank pain with
onset last night and worsening today. Pt was seen on [DATE] for
similar symptoms, had a cholecystectomy which was performed 2 weeks
ago. He notes associated dysuria, nausea and hematuria.

EXAM:
CT ABDOMEN AND PELVIS WITHOUT CONTRAST
TECHNIQUE: Multidetector CT imaging of the abdomen and pelvis was performed
following the standard protocol without IV contrast.

[Series 3: lung 5.0 b60f · axial · 0.66mm/px · z∈[-112,-48]mm · 4 of 23 slices shown, 9 images]
[im 5/23  soft-tissue]
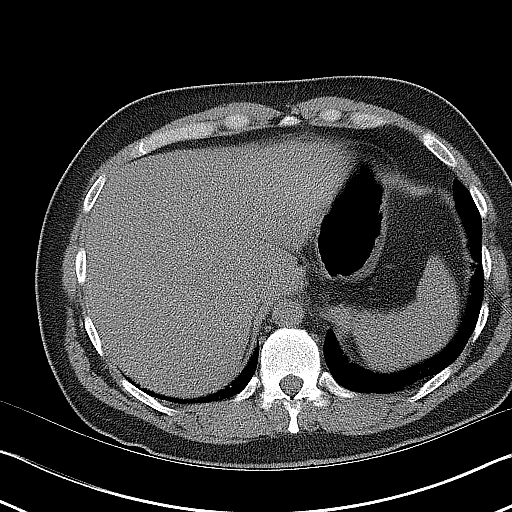
[im 5/23  lung]
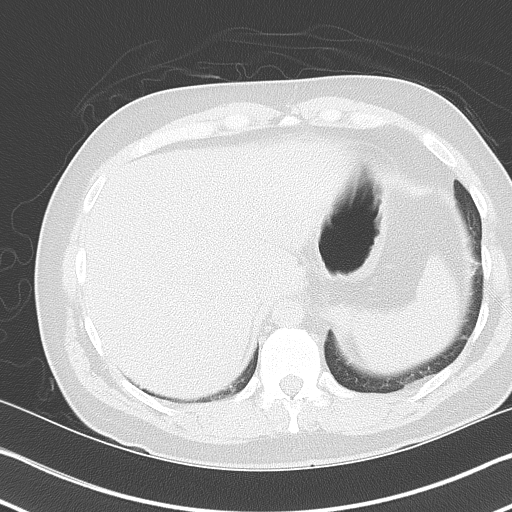
[im 5/23  bone]
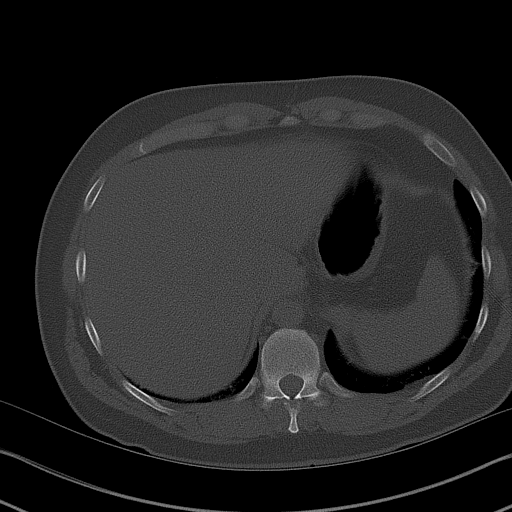
[im 9/23  soft-tissue]
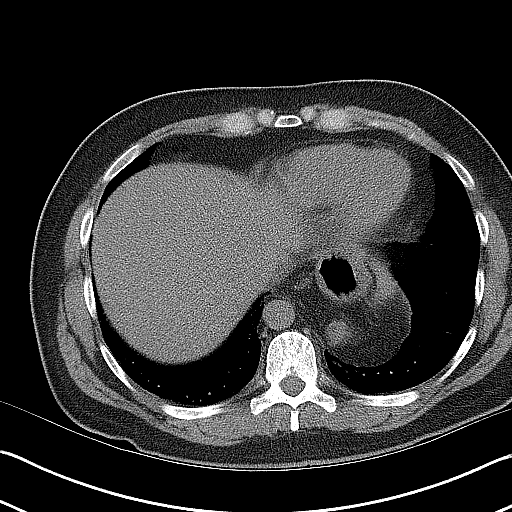
[im 9/23  lung]
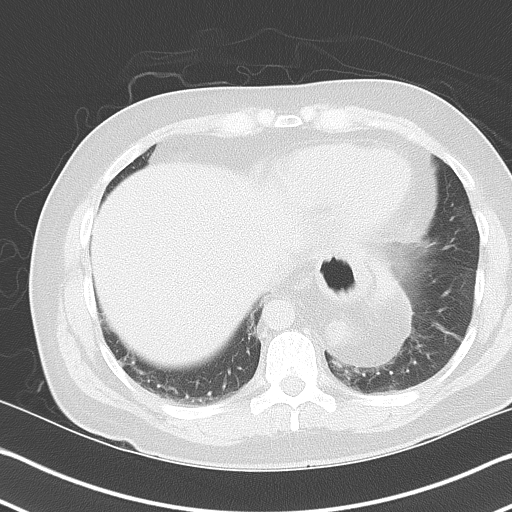
[im 14/23  soft-tissue]
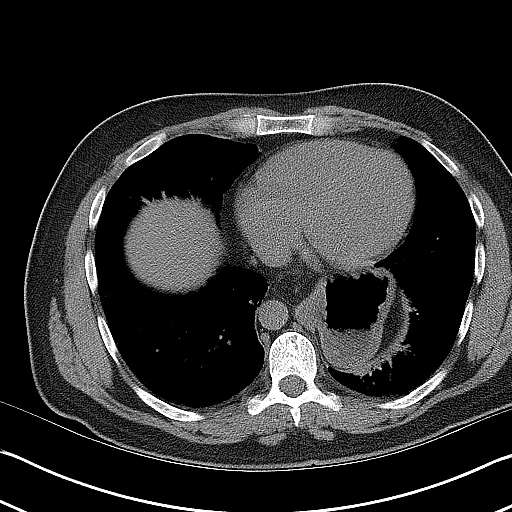
[im 14/23  lung]
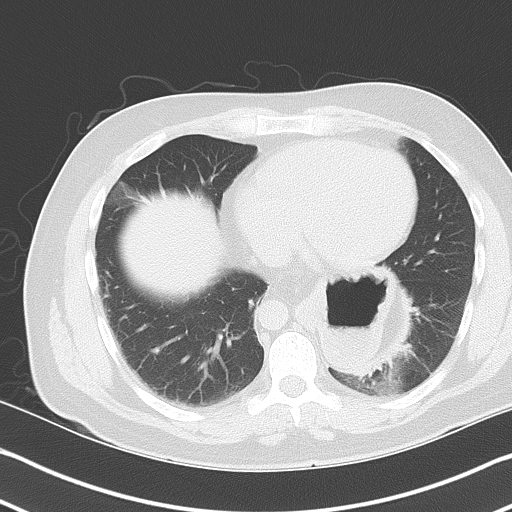
[im 18/23  soft-tissue]
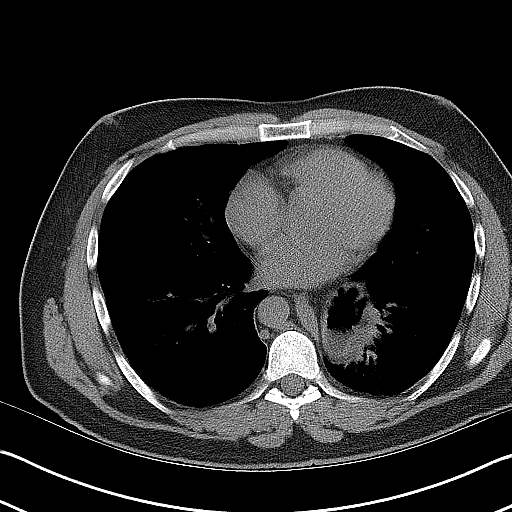
[im 18/23  lung]
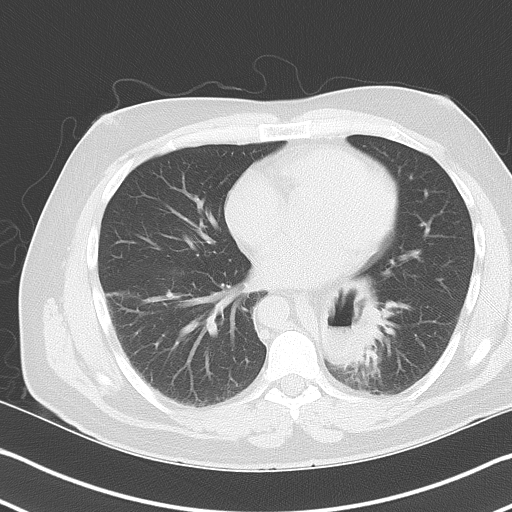

[Series 4: mpr coronal (id) · coronal · 0.70mm/px · 3 of 84 slices shown, 4 images]
[im 28/84  soft-tissue]
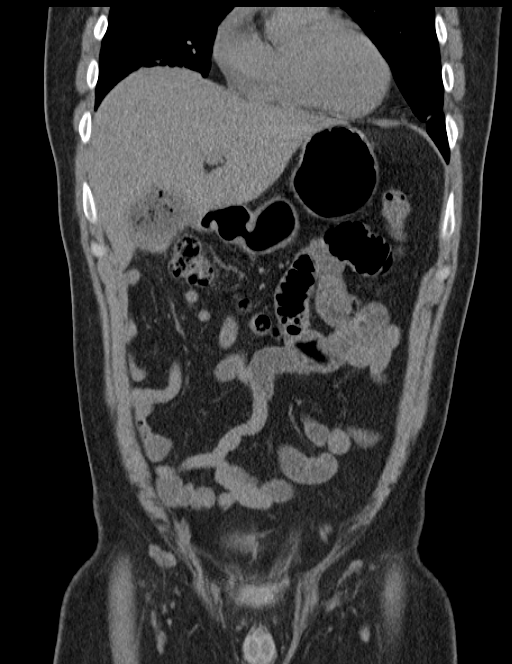
[im 37/84  soft-tissue]
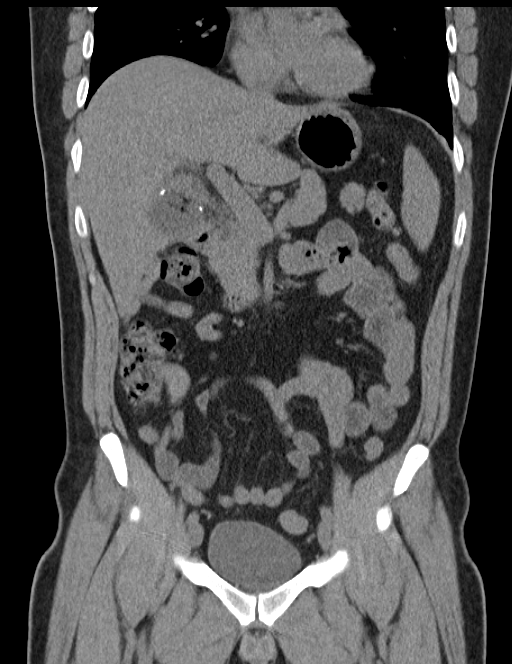
[im 37/84  bone]
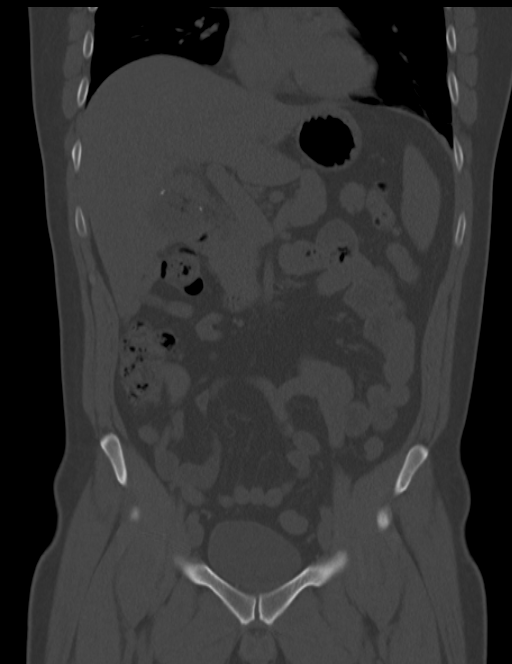
[im 47/84  soft-tissue]
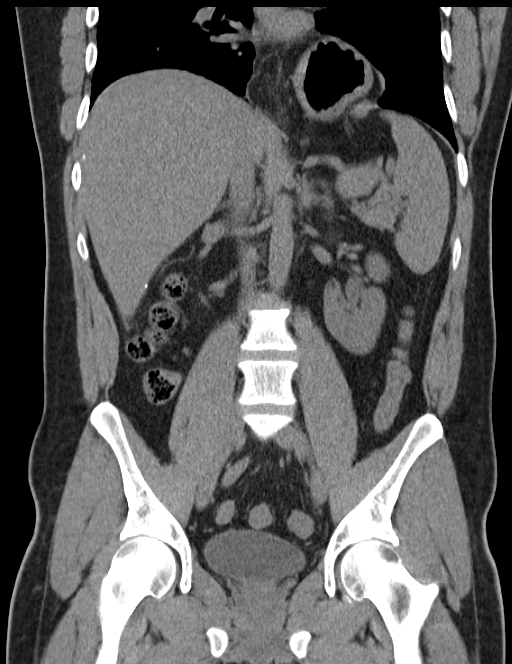

[Series 5: mpr sagittal (id) · sagittal · 0.62mm/px · 1 of 104 slices shown, 2 images]
[im 35/104  soft-tissue]
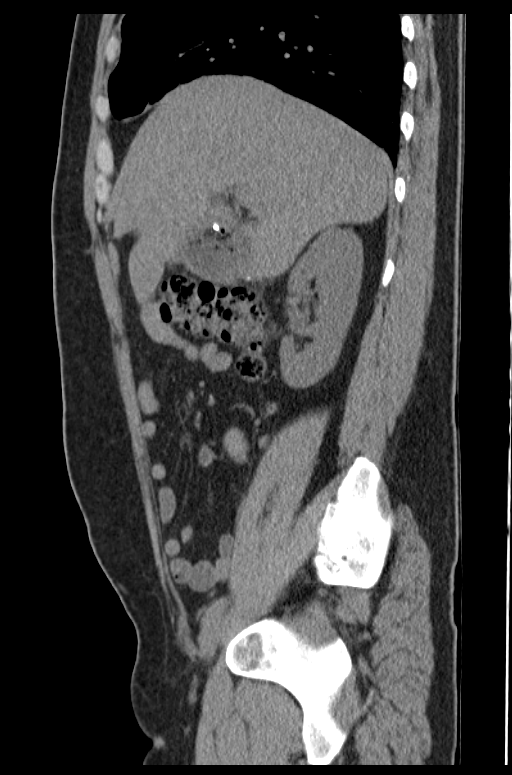
[im 35/104  bone]
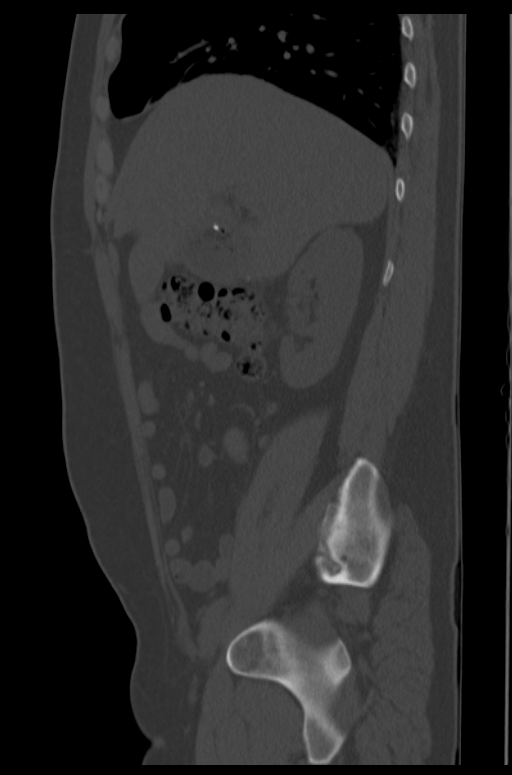

[8 of 46 positions shown; findings below may reference images not displayed]

FINDINGS: Lower chest: Heart size is normal. No imaged pericardial effusion or
significant coronary artery calcifications. There is minimal left
lower lobe atelectasis.

Upper abdomen: There is a complex collection of fluid and gas within
the gallbladder fossa region. Surgical clips are identified within
the surgical bed. Collection measures at least 7.7 x 4.6 cm.

Small hepatic granulomata are present. No focal abnormality
identified within the spleen, pancreas, or adrenal glands. Small
intrarenal calculi are identified in the kidneys bilaterally,
largest on the left measure pain 0.5 cm. There is no hydronephrosis
or ureteral stones.

Gastrointestinal tract: Large hiatal hernia is present. Distal
stomach has a normal appearance. Small bowel loops are normal in
appearance. Numerous colonic diverticula are present. The appendix
is well seen and has a normal appearance.

Pelvis: The urinary bladder, prostate gland, and seminal vesicles
have a normal appearance. Visualized urethra is unremarkable.
Multiple pelvic phleboliths are identified. No distal urinary tract
calculi are identified. No free pelvic fluid.

Retroperitoneum: No evidence for aortic aneurysm. No retroperitoneal
or mesenteric adenopathy.

Abdominal wall: Postoperative changes are identified in the anterior
abdominal wall. No focal fluid collections.

Osseous structures: Negative
IMPRESSION: 1. Complex collection of fluid and gas within the gallbladder fossa
region is greater than expected for typical postoperative changes
given the interval of 2 weeks since surgery. Findings are suspicious
for abscess following cholecystectomy. Therefore correlation with
physical exam findings recommended.
2. Bilateral nonobstructing intrarenal stones. No evidence for
ureteral stone or other urinary tract obstruction.
3. Expected postoperative changes in the anterior abdominal wall.
4. Large hiatal hernia.
5. Colonic diverticulosis.
6. Normal appendix.

## 2018-07-10 ENCOUNTER — Other Ambulatory Visit: Payer: Self-pay

## 2018-07-10 ENCOUNTER — Emergency Department (HOSPITAL_COMMUNITY): Payer: BLUE CROSS/BLUE SHIELD

## 2018-07-10 ENCOUNTER — Emergency Department (HOSPITAL_COMMUNITY)
Admission: EM | Admit: 2018-07-10 | Discharge: 2018-07-10 | Disposition: A | Discharge status: Home / Self Care | Payer: BLUE CROSS/BLUE SHIELD | Attending: Emergency Medicine | Admitting: Emergency Medicine

## 2018-07-10 ENCOUNTER — Encounter (HOSPITAL_COMMUNITY): Payer: Self-pay

## 2018-07-10 DIAGNOSIS — H60502 Unspecified acute noninfective otitis externa, left ear: Secondary | ICD-10-CM | POA: Diagnosis not present

## 2018-07-10 DIAGNOSIS — G501 Atypical facial pain: Secondary | ICD-10-CM | POA: Diagnosis present

## 2018-07-10 LAB — COMPREHENSIVE METABOLIC PANEL
ALBUMIN: 4.2 g/dL (ref 3.5–5.0)
ALK PHOS: 62 U/L (ref 38–126)
ALT: 114 U/L — AB (ref 0–44)
AST: 61 U/L — AB (ref 15–41)
Anion gap: 7 (ref 5–15)
BILIRUBIN TOTAL: 1.2 mg/dL (ref 0.3–1.2)
BUN: 19 mg/dL (ref 6–20)
CALCIUM: 9.4 mg/dL (ref 8.9–10.3)
CO2: 31 mmol/L (ref 22–32)
Chloride: 101 mmol/L (ref 98–111)
Creatinine, Ser: 0.82 mg/dL (ref 0.61–1.24)
GFR calc Af Amer: >60 mL/min (ref >60)
GFR calc non Af Amer: >60 mL/min (ref >60)
GLUCOSE: 106 mg/dL — AB (ref 70–99)
Potassium: 3.9 mmol/L (ref 3.5–5.1)
SODIUM: 139 mmol/L (ref 135–145)
TOTAL PROTEIN: 7.7 g/dL (ref 6.5–8.1)

## 2018-07-10 LAB — CBC WITH DIFFERENTIAL/PLATELET
Basophils Absolute: 0 10*3/uL (ref 0.0–0.1)
Basophils Relative: 0 %
EOS PCT: 2 %
Eosinophils Absolute: 0.3 10*3/uL (ref 0.0–0.7)
HCT: 42 % (ref 39.0–52.0)
Hemoglobin: 14.7 g/dL (ref 13.0–17.0)
LYMPHS ABS: 3.1 10*3/uL (ref 0.7–4.0)
LYMPHS PCT: 28 %
MCH: 32.2 pg (ref 26.0–34.0)
MCHC: 35 g/dL (ref 30.0–36.0)
MCV: 92.1 fL (ref 78.0–100.0)
MONO ABS: 1.1 10*3/uL — AB (ref 0.1–1.0)
Monocytes Relative: 10 %
Neutro Abs: 6.4 10*3/uL (ref 1.7–7.7)
Neutrophils Relative %: 60 %
Platelets: 251 10*3/uL (ref 150–400)
RBC: 4.56 MIL/uL (ref 4.22–5.81)
RDW: 12.2 % (ref 11.5–15.5)
WBC: 10.9 10*3/uL — ABNORMAL HIGH (ref 4.0–10.5)

## 2018-07-10 MED ORDER — CIPROFLOXACIN HCL 250 MG PO TABS
500.0000 mg | ORAL_TABLET | Freq: Once | ORAL | Status: AC
  Administered 2018-07-10: 500 mg via ORAL
  Filled 2018-07-10: qty 2

## 2018-07-10 MED ORDER — NEOMYCIN-POLYMYXIN-HC 3.5-10000-1 OT SUSP: 4.0000 [drp] | Freq: Three times a day (TID) | OTIC | 0 refills | Status: AC

## 2018-07-10 MED ORDER — OXYCODONE-ACETAMINOPHEN 5-325 MG PO TABS
ORAL_TABLET | ORAL | Status: AC
  Administered 2018-07-10: 2
  Filled 2018-07-10: qty 2

## 2018-07-10 MED ORDER — OXYCODONE-ACETAMINOPHEN 5-325 MG PO TABS: 2.0000 | ORAL_TABLET | Freq: Once | ORAL | Status: DC

## 2018-07-10 MED ORDER — NEOMYCIN-POLYMYXIN-HC 1 % OT SOLN
3.0000 [drp] | Freq: Once | OTIC | Status: DC
  Filled 2018-07-10: qty 10

## 2018-07-10 MED ORDER — AMOXICILLIN-POT CLAVULANATE 875-125 MG PO TABS
1.0000 | ORAL_TABLET | Freq: Once | ORAL | Status: AC
  Administered 2018-07-10: 1 via ORAL
  Filled 2018-07-10: qty 1

## 2018-07-10 MED ORDER — IOPAMIDOL (ISOVUE-300) INJECTION 61%
75.0000 mL | Freq: Once | INTRAVENOUS | Status: AC | PRN
  Administered 2018-07-10: 75 mL via INTRAVENOUS

## 2018-07-10 MED ORDER — IBUPROFEN 800 MG PO TABS: 800.0000 mg | ORAL_TABLET | Freq: Three times a day (TID) | ORAL | 0 refills | Status: AC | PRN

## 2018-07-10 MED ORDER — NEOMYCIN-POLYMYXIN-HC 1 % OT SOLN
3.0000 [drp] | Freq: Once | OTIC | Status: AC
  Administered 2018-07-10: 3 [drp] via OTIC

## 2018-07-10 MED ORDER — AMOXICILLIN-POT CLAVULANATE 875-125 MG PO TABS: 1.0000 | ORAL_TABLET | Freq: Two times a day (BID) | ORAL | 0 refills | Status: AC

## 2018-07-10 MED ORDER — CIPROFLOXACIN HCL 500 MG PO TABS: 500.0000 mg | ORAL_TABLET | Freq: Two times a day (BID) | ORAL | 0 refills | Status: AC

## 2018-07-10 MED ORDER — IBUPROFEN 400 MG PO TABS
400.0000 mg | ORAL_TABLET | Freq: Once | ORAL | Status: DC
  Filled 2018-07-10: qty 1

## 2018-07-10 NOTE — ED Triage Notes (Signed)
Pt reports pain to left side of face x 4 days ago, now with some redness and swelling near the left ear.  Pt states it feels numb and is painful to touch.

## 2018-07-10 NOTE — ED Provider Notes (Signed)
[facility], [place] EMERGENCY DEPARTMENT Provider Note   CSN: [id number] Arrival date & time: 07/10/18  0056     History   Chief Complaint Chief Complaint  Patient presents with  . Facial Pain    HPI Mark Andrade is a 42 y.o. male.  Patient presents with a 4-day history of pain and swelling to his left side of his face and left ear.  States is not been able to hear out of his ear for several days.  Reports severe pain to his face anterior to his left ear that extends down to his cheek.  Denies any trauma.  Denies any fevers.  No chills, nausea or vomiting.  No difficulty breathing or difficulty swallowing.  No drainage from the ear.  No chest pain or shortness of breath.  No cough.  Pain is worse to palpation involving his left preauricular area and left ear itself.  He is not diabetic.  The history is provided by the patient.    Past Medical History:  Diagnosis Date  . Renal disorder     There are no active problems to display for this patient.   Past Surgical History:  Procedure Laterality Date  . CHOLECYSTECTOMY          Home Medications    Prior to Admission medications   Medication Sig Start Date End Date Taking? Authorizing Provider  HYDROcodone-acetaminophen (NORCO) 5-325 MG tablet Take 1-2 tablets by mouth every 4 (four) hours as needed for severe pain. 01/04/16   Blane Ohara, MD  naproxen (NAPROSYN) 500 MG tablet Take 1 tablet (500 mg total) by mouth 2 (two) times daily as needed. 01/04/16   Blane Ohara, MD  ondansetron (ZOFRAN ODT) 4 MG disintegrating tablet 4mg  ODT q4 hours prn nausea/vomit Patient not taking: Reported on 01/04/2016 10/27/15   Mesner, Barbara Cower, MD  oxyCODONE-acetaminophen (PERCOCET/ROXICET) 5-325 MG tablet Take 1 tablet by mouth every 4 (four) hours as needed. Patient not taking: Reported on 01/04/2016 10/27/15   Mesner, Barbara Cower, MD  tamsulosin (FLOMAX) 0.4 MG CAPS capsule Take 1 capsule (0.4 mg total) by mouth daily after supper. Patient not  taking: Reported on 01/04/2016 09/29/15   Burgess Amor, PA-C    Family History No family history on file.  Social History Social History   Tobacco Use  . Smoking status: Never Smoker  . Smokeless tobacco: Never Used  Substance Use Topics  . Alcohol use: Yes    Comment: occasional  . Drug use: No     Allergies   Patient has no known allergies.   Review of Systems Review of Systems  Constitutional: Negative for activity change, appetite change, fatigue and fever.  HENT: Positive for ear discharge, ear pain and facial swelling. Negative for congestion, sinus pain and voice change.   Eyes: Negative for visual disturbance.  Respiratory: Negative for cough, chest tightness and shortness of breath.   Cardiovascular: Negative for chest pain.  Gastrointestinal: Negative for abdominal pain, nausea and vomiting.  Genitourinary: Negative for dysuria, hematuria and testicular pain.  Musculoskeletal: Negative for arthralgias, myalgias, neck pain and neck stiffness.  Neurological: Negative for dizziness, weakness and headaches.    all other systems are negative except as noted in the HPI and PMH.    Physical Exam Updated Vital Signs BP (!) 149/94 (BP Location: Right Arm)   Pulse 91   Temp 97.9 F (36.6 C) (Oral)   Resp 20   Ht 5\' 4"  (1.626 m)   Wt 79.4 kg   SpO2 95%  BMI 30.04 kg/m   Physical Exam  Constitutional: He is oriented to person, place, and time. He appears well-developed and well-nourished. No distress.  HENT:  Head: Normocephalic and atraumatic.  Mouth/Throat: Oropharynx is clear and moist. No oropharyngeal exudate.  Preauricular erythema and edema that is very tender on the left. Left tragus tenderness.  Unable to visualize TM due to swelling of auditory canal.  There is no mastoid tenderness  No tragus or mastoid tenderness on the right.  No malocclusion or trismus.  No difficulty swallowing secretions.  Eyes: Pupils are equal, round, and reactive to light.  Conjunctivae and EOM are normal.  Neck: Normal range of motion. Neck supple.  No meningismus.  Cardiovascular: Normal rate, regular rhythm, normal heart sounds and intact distal pulses.  No murmur heard. Pulmonary/Chest: Effort normal and breath sounds normal. No respiratory distress. He exhibits no tenderness.  Abdominal: Soft. There is no tenderness. There is no rebound and no guarding.  Musculoskeletal: Normal range of motion. He exhibits no edema or tenderness.  Neurological: He is alert and oriented to person, place, and time. No cranial nerve deficit. He exhibits normal muscle tone. Coordination normal.  No ataxia on finger to nose bilaterally. No pronator drift. 5/5 strength throughout. CN 2-12 intact.Equal grip strength. Sensation intact.   Skin: Skin is warm.  Psychiatric: He has a normal mood and affect. His behavior is normal.  Nursing note and vitals reviewed.    ED Treatments / Results  Labs (all labs ordered are listed, but only abnormal results are displayed) Labs Reviewed  CBC WITH DIFFERENTIAL/PLATELET - Abnormal; Notable for the following components:      Result Value   WBC 10.9 (*)    Monocytes Absolute 1.1 (*)    All other components within normal limits  COMPREHENSIVE METABOLIC PANEL - Abnormal; Notable for the following components:   Glucose, Bld 106 (*)    AST 61 (*)    ALT 114 (*)    All other components within normal limits    EKG None  Radiology Ct Maxillofacial W Contrast  Result Date: 07/10/2018 CLINICAL DATA:  LEFT facial pain, redness and swelling for 4 days. EXAM: CT MAXILLOFACIAL WITH CONTRAST TECHNIQUE: Multidetector CT imaging of the maxillofacial structures was performed with intravenous contrast. Multiplanar CT image reconstructions were also generated. CONTRAST:  75mL ISOVUE-300 IOPAMIDOL (ISOVUE-300) INJECTION 61% COMPARISON:  CT HEAD May 06, 2015 FINDINGS: Mild motion degraded examination. OSSEOUS: The mandible is intact, the condyles are  located. No acute facial fracture. No destructive bony lesions. ORBITS: Ocular globes and orbital contents are normal. SINUSES: Paranasal sinuses are well aerated. Nasal septum is midline. LEFT middle ear and mastoid effusion without air cell coalescence. SOFT TISSUES: LEFT irregular soft tissue swelling standing into external auditory canal without erosions. Mildly thickened LEFT platysma with LEFT periauricular soft tissue swelling and subcutaneous fat stranding. Subcentimeter presumably reactive LEFT parotid lymph node. Borderline LEFT L5 reactive lymph nodes. LIMITED INTRACRANIAL: Normal. IMPRESSION: 1. LEFT otitis externa and auricular cellulitis.  No abscess. 2. LEFT middle ear and mastoid effusion. 3. Borderline reactive LEFT level 5 lymphadenopathy. Electronically Signed   By: Awilda Metro M.D.   On: 07/10/2018 02:23    Procedures Procedures (including critical care time)  Medications Ordered in ED Medications  oxyCODONE-acetaminophen (PERCOCET/ROXICET) 5-325 MG per tablet 2 tablet (has no administration in time range)  iopamidol (ISOVUE-300) 61 % injection 75 mL (has no administration in time range)     Initial Impression / Assessment and Plan /  ED Course  I have reviewed the triage vital signs and the nursing notes.  Pertinent labs & imaging results that were available during my care of the patient were reviewed by me and considered in my medical decision making (see chart for details).    4 days of left-sided facial pain and ear pain with decreased hearing.  No fever.  Exam consistent with otitis externa.  There is no mastoid tenderness.  Ear wick placed and patient started on topical antibiotic drops. CT shows left otitis externa on auricular cellulitis without abscess.  There is left middle ear mastoid effusion.  Patient given p.o. antibiotics as well as topical antibiotics. Cipro and augmentin with cortisporin gtts.  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 He agrees to follow-up with ENT on Monday.  Return to the ED sooner with worsening symptoms.  Given extensive facial swelling and edema will obtain CT scan.  Final Clinical Impressions(s) / ED Diagnoses   Final diagnoses:  Acute otitis externa of left ear, unspecified type    ED Discharge Orders    None       Shamariah Shewmake, Jeannett SeniorStephen, MD 07/10/18 0400

## 2018-07-10 NOTE — Discharge Instructions (Addendum)
Take the antibiotics and use the drops as prescribed.  Follow-up with ENT Dr. Jearld FentonByers next week.  Return to the ED with worsening pain, swelling, fever or any other concerns.
